# Patient Record
Sex: Male | Born: 1980 | Race: White | Hispanic: No | Marital: Married | Smoking: Current every day smoker
Health system: Southern US, Community
[De-identification: ages and names within clinical notes are randomized; demographics above are authoritative.]

## PROBLEM LIST (undated history)

## (undated) DIAGNOSIS — Z8619 Personal history of other infectious and parasitic diseases: Secondary | ICD-10-CM

## (undated) DIAGNOSIS — I10 Essential (primary) hypertension: Secondary | ICD-10-CM

## (undated) DIAGNOSIS — F419 Anxiety disorder, unspecified: Secondary | ICD-10-CM

## (undated) HISTORY — PX: ORTHOPEDIC SURGERY: SHX850

## (undated) HISTORY — PX: CHOLECYSTECTOMY: SHX55

## (undated) HISTORY — DX: Personal history of other infectious and parasitic diseases: Z86.19

## (undated) HISTORY — DX: Essential (primary) hypertension: I10

## (undated) HISTORY — DX: Anxiety disorder, unspecified: F41.9

---

## 2000-07-04 ENCOUNTER — Emergency Department (HOSPITAL_COMMUNITY): Admission: EM | Admit: 2000-07-04 | Discharge: 2000-07-04 | Payer: Self-pay | Admitting: Emergency Medicine

## 2007-06-21 ENCOUNTER — Emergency Department (HOSPITAL_COMMUNITY): Admission: EM | Admit: 2007-06-21 | Discharge: 2007-06-21 | Payer: Self-pay | Admitting: Emergency Medicine

## 2008-09-10 ENCOUNTER — Emergency Department (HOSPITAL_COMMUNITY): Admission: EM | Admit: 2008-09-10 | Discharge: 2008-09-10 | Payer: Self-pay | Admitting: Emergency Medicine

## 2008-09-11 ENCOUNTER — Emergency Department (HOSPITAL_BASED_OUTPATIENT_CLINIC_OR_DEPARTMENT_OTHER): Admission: EM | Admit: 2008-09-11 | Discharge: 2008-09-11 | Payer: Self-pay | Admitting: Emergency Medicine

## 2008-09-11 ENCOUNTER — Ambulatory Visit: Payer: Self-pay | Admitting: Diagnostic Radiology

## 2008-09-15 ENCOUNTER — Inpatient Hospital Stay (HOSPITAL_COMMUNITY): Admission: EM | Admit: 2008-09-15 | Discharge: 2008-09-20 | Payer: Self-pay | Admitting: Internal Medicine

## 2008-09-15 ENCOUNTER — Emergency Department (HOSPITAL_COMMUNITY): Admission: EM | Admit: 2008-09-15 | Discharge: 2008-09-15 | Payer: Self-pay | Admitting: Emergency Medicine

## 2008-09-15 ENCOUNTER — Other Ambulatory Visit: Payer: Self-pay | Admitting: Emergency Medicine

## 2008-09-15 DIAGNOSIS — R1011 Right upper quadrant pain: Secondary | ICD-10-CM

## 2008-09-19 ENCOUNTER — Encounter (INDEPENDENT_AMBULATORY_CARE_PROVIDER_SITE_OTHER): Payer: Self-pay | Admitting: General Surgery

## 2008-09-22 ENCOUNTER — Ambulatory Visit: Payer: Self-pay | Admitting: Diagnostic Radiology

## 2008-09-22 ENCOUNTER — Ambulatory Visit (HOSPITAL_BASED_OUTPATIENT_CLINIC_OR_DEPARTMENT_OTHER): Admission: RE | Admit: 2008-09-22 | Discharge: 2008-09-22 | Payer: Self-pay | Admitting: Family Medicine

## 2008-09-25 ENCOUNTER — Emergency Department (HOSPITAL_BASED_OUTPATIENT_CLINIC_OR_DEPARTMENT_OTHER): Admission: EM | Admit: 2008-09-25 | Discharge: 2008-09-25 | Payer: Self-pay | Admitting: Emergency Medicine

## 2010-07-25 HISTORY — PX: CHOLECYSTECTOMY: SHX55

## 2010-07-25 HISTORY — PX: ORTHOPEDIC SURGERY: SHX850

## 2010-10-16 IMAGING — CT CT PELVIS W/ CM
2 of 8 series · 13 of 46 positions shown, 19 images · IV contrast (agent unspecified)
Comparison: CT abdomen pelvis of 09/10/2008

CT ABDOMEN

CLINICAL DATA: Abdominal pain, nausea, vomiting, diarrhea,
elevated liver function tests

CT ABDOMEN WITHOUT AND WITH CONTRAST
CT PELVIS WITH CONTRAST
TECHNIQUE: Multidetector CT imaging of the abdomen was performed
initially following the standard protocol before administration of
intravenous contrast.  Multidetector CT imaging of the abdomen and
pelvis was then performed following the standard protocol during
the bolus injection of intravenous contrast.
Contrast: 125 ml Wmnipaque-YLL

[Series 5: pancreas portal venous · axial · portal-venous · 0.78mm/px · z∈[-630,-225]mm · 10 of 99 slices shown, 16 images]
[im 9/99  soft-tissue]
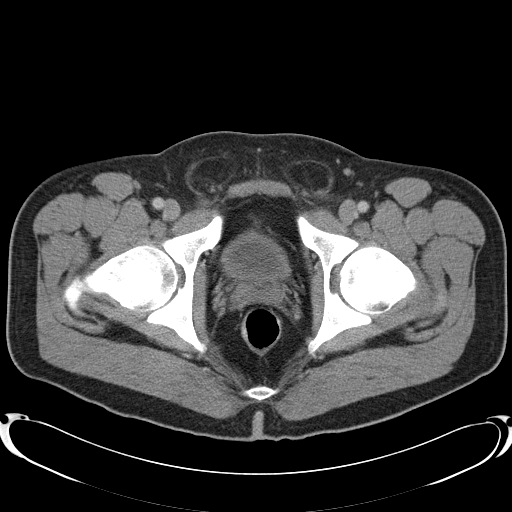
[im 9/99  bone]
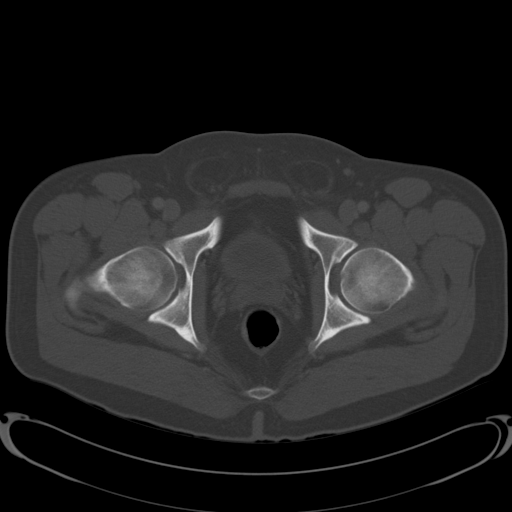
[im 18/99  soft-tissue]
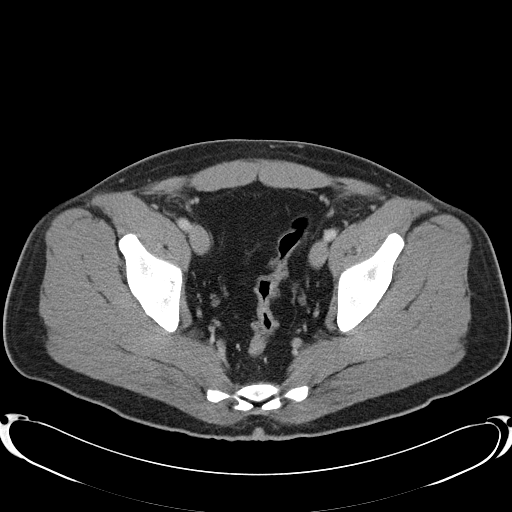
[im 27/99  soft-tissue]
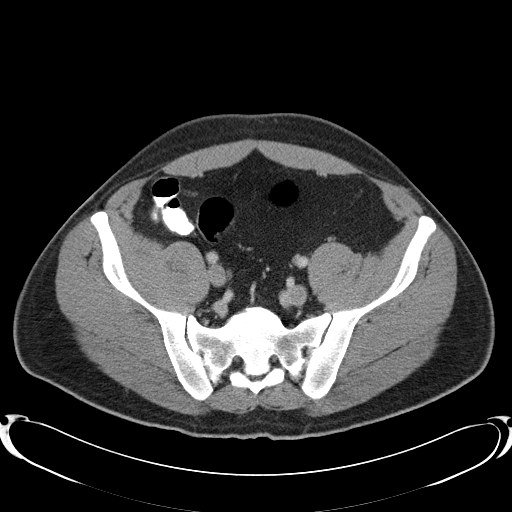
[im 36/99  soft-tissue]
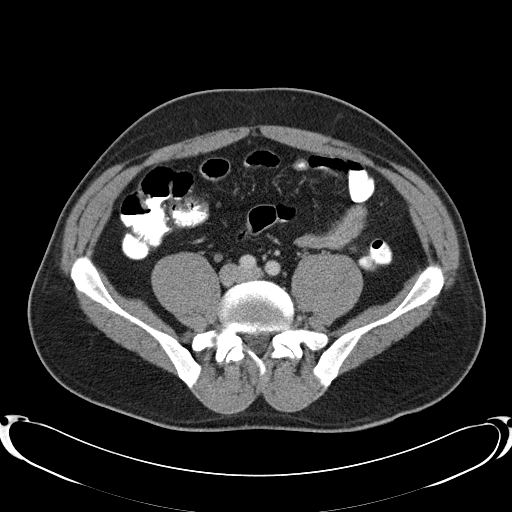
[im 45/99  soft-tissue]
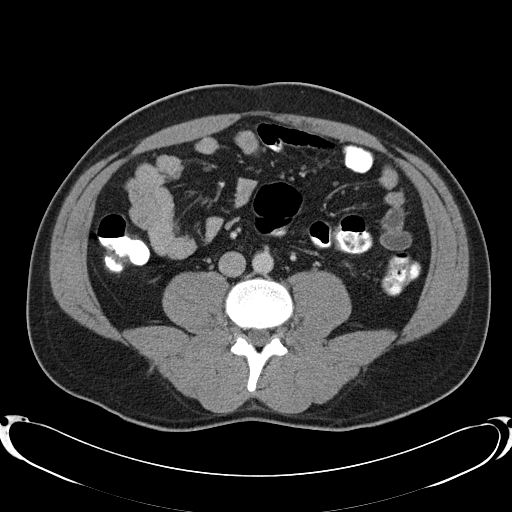
[im 54/99  soft-tissue]
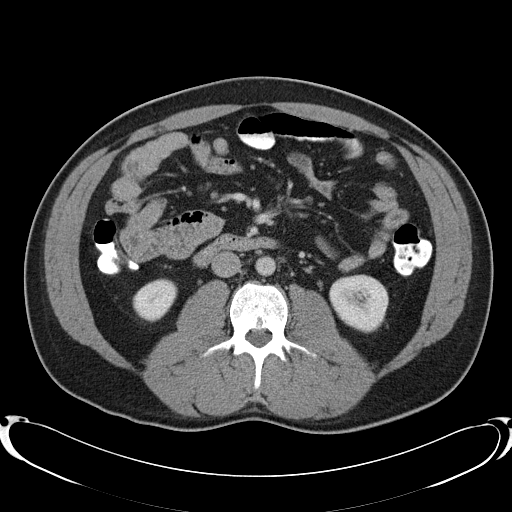
[im 63/99  soft-tissue]
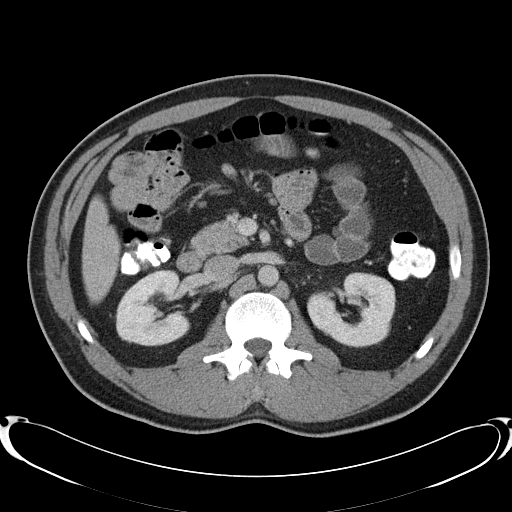
[im 63/99  lung]
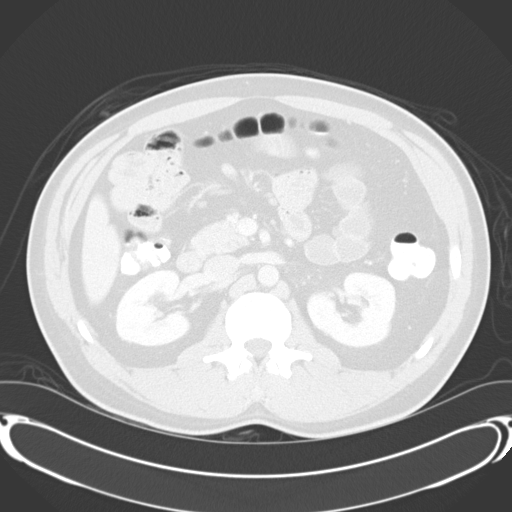
[im 72/99  soft-tissue]
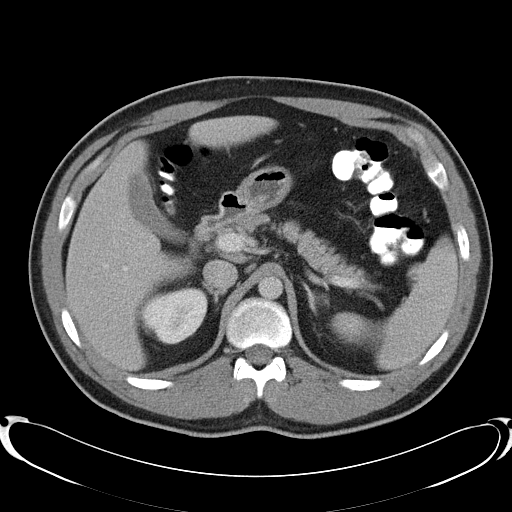
[im 72/99  lung]
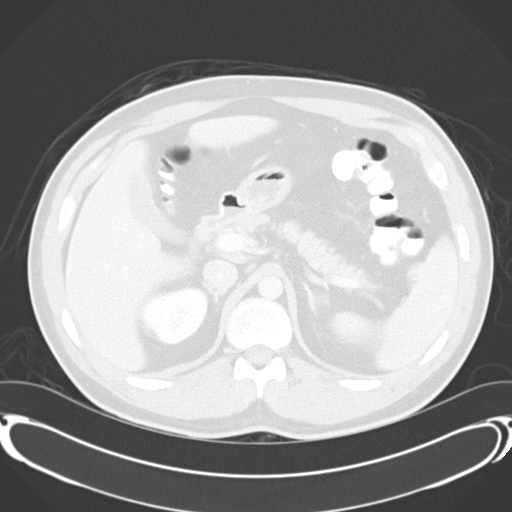
[im 81/99  soft-tissue]
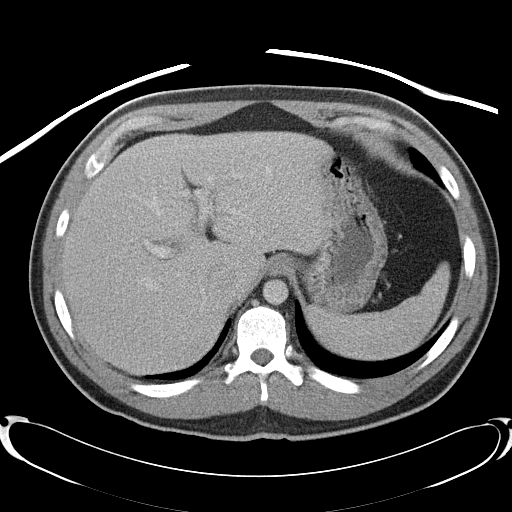
[im 81/99  lung]
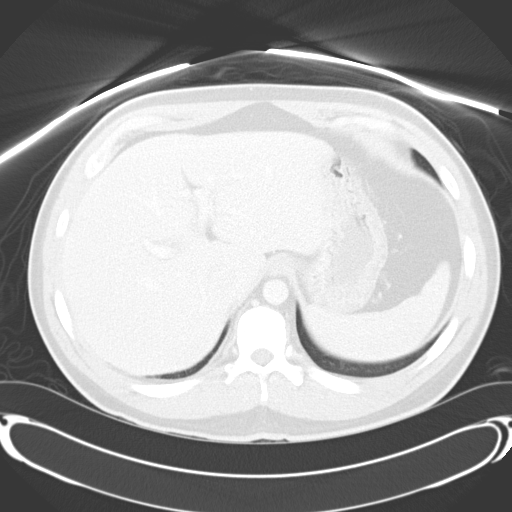
[im 81/99  bone]
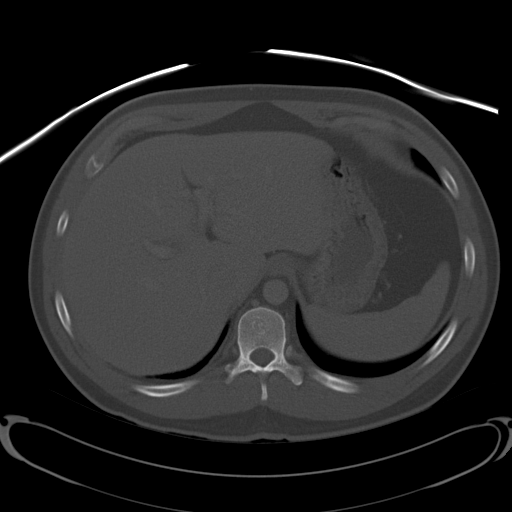
[im 90/99  soft-tissue]
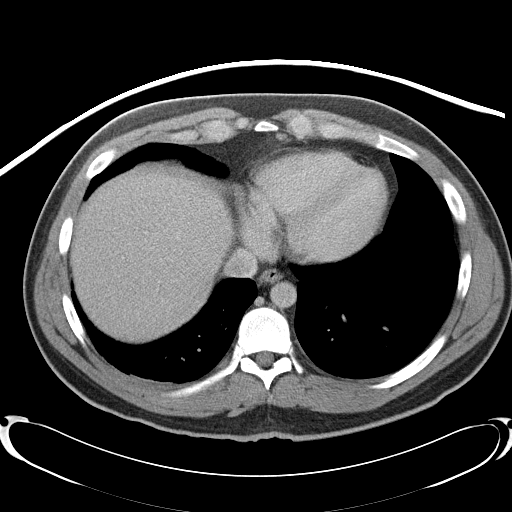
[im 90/99  lung]
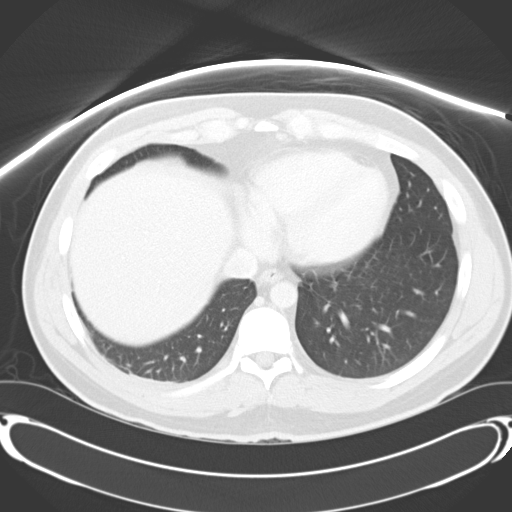

[Series 602: <mpr thick range> · coronal · 0.78mm/px · 3 of 82 slices shown]
[im 21/82  soft-tissue]
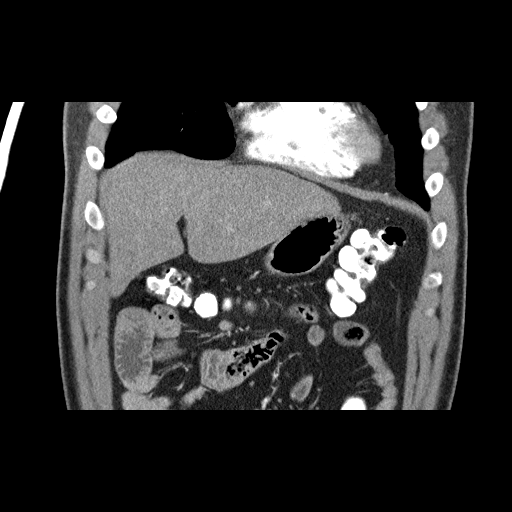
[im 41/82  soft-tissue]
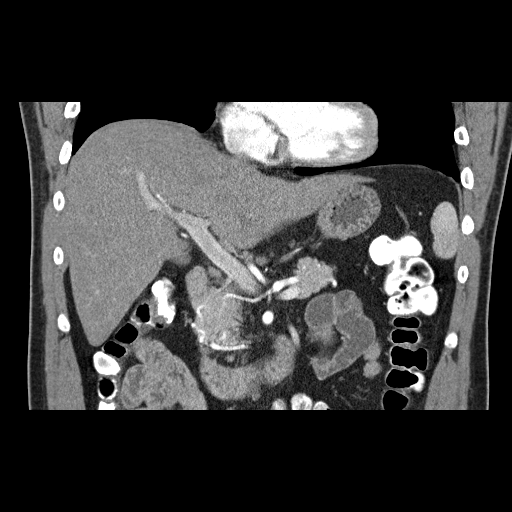
[im 61/82  soft-tissue]
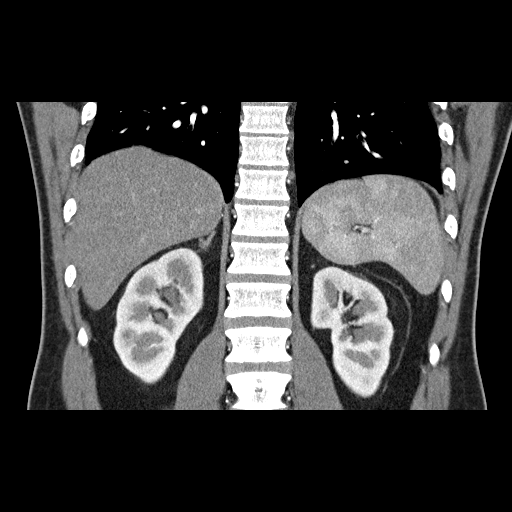

[13 of 46 positions shown; findings below may reference images not displayed]

FINDINGS: The lung bases are clear. On the unenhanced study no
renal calculi are seen.  No calcified gallstones are noted.

 After contrast administration, the liver enhances with no focal
abnormality and no ductal dilatation is seen. The arterial phase
shows the origin of the celiac axis to be narrowed. The origin of
the SMA and  both renal arteries appear normal., No calcified
gallstones are noted.  The pancreas is normal in size and the
pancreatic duct is not dilated. There is no CT evidence of acute
pancreatitis.  The adrenal glands and spleen appear normal. The
kidneys enhance with no mass and no hydronephrosis.  The aorta is
normal in caliber. No adenopathy is seen, although multiple small
mesenteric nodes are present.
IMPRESSION: 1. No abnormality.  No CT evidence of pancreatitis is seen.
 2.  Narrowed origin of the celiac axis.

CT PELVIS
FINDINGS: The urinary bladder is not well distended and is
slightly thick-walled.  The appendix is well seen and appears
normal, as is the terminal ileum.  The prostate is normal in size.
No pelvic mass, fluid, or adenopathy is seen. No bony abnormality
is seen.
IMPRESSION: Negative CT the pelvis.  The appendix and terminal ileum appear
normal.

## 2010-11-04 LAB — COMPREHENSIVE METABOLIC PANEL
ALT: 507 U/L — ABNORMAL HIGH (ref 0–53)
AST: 102 U/L — ABNORMAL HIGH (ref 0–37)
BUN: 9 mg/dL (ref 6–23)
CO2: 26 mEq/L (ref 19–32)
Calcium: 9.3 mg/dL (ref 8.4–10.5)
GFR calc non Af Amer: >60 mL/min (ref >60)
Potassium: 4.6 mEq/L (ref 3.5–5.1)
Sodium: 141 mEq/L (ref 135–145)
Total Bilirubin: 1.4 mg/dL — ABNORMAL HIGH (ref 0.3–1.2)
Total Protein: 7.5 g/dL (ref 6.0–8.3)

## 2010-11-04 LAB — CBC
HCT: 44.8 % (ref 39.0–52.0)
Hemoglobin: 14.8 g/dL (ref 13.0–17.0)
MCHC: 33 g/dL (ref 30.0–36.0)
MCV: 92 fL (ref 78.0–100.0)
RDW: 12.1 % (ref 11.5–15.5)

## 2010-11-04 LAB — DIFFERENTIAL
Basophils Absolute: 0 10*3/uL (ref 0.0–0.1)
Basophils Relative: 0 % (ref 0–1)
Eosinophils Relative: 2 % (ref 0–5)
Lymphs Abs: 2.6 10*3/uL (ref 0.7–4.0)
Monocytes Relative: 10 % (ref 3–12)
Neutrophils Relative %: 66 % (ref 43–77)

## 2010-11-04 LAB — URINALYSIS, ROUTINE W REFLEX MICROSCOPIC
Ketones, ur: NEGATIVE mg/dL
Nitrite: NEGATIVE
Specific Gravity, Urine: 1.021 (ref 1.005–1.030)
pH: 6.5 (ref 5.0–8.0)

## 2010-11-09 LAB — DIFFERENTIAL
Basophils Absolute: 0 10*3/uL (ref 0.0–0.1)
Basophils Relative: 0 % (ref 0–1)
Basophils Relative: 0 % (ref 0–1)
Eosinophils Absolute: 0.8 10*3/uL — ABNORMAL HIGH (ref 0.0–0.7)
Eosinophils Absolute: 0.3 10*3/uL (ref 0.0–0.7)
Eosinophils Relative: 11 % — ABNORMAL HIGH (ref 0–5)
Eosinophils Relative: 2 % (ref 0–5)
Eosinophils Relative: 5 % (ref 0–5)
Lymphocytes Relative: 25 % (ref 12–46)
Lymphocytes Relative: 14 % (ref 12–46)
Lymphs Abs: 1.7 10*3/uL (ref 0.7–4.0)
Lymphs Abs: 1.7 10*3/uL (ref 0.7–4.0)
Monocytes Absolute: 0.8 10*3/uL (ref 0.1–1.0)
Monocytes Absolute: 0.7 10*3/uL (ref 0.1–1.0)
Monocytes Relative: 6 % (ref 3–12)
Monocytes Relative: 7 % (ref 3–12)
Neutro Abs: 3.7 10*3/uL (ref 1.7–7.7)
Neutro Abs: 9.5 10*3/uL — ABNORMAL HIGH (ref 1.7–7.7)
Neutrophils Relative %: 53 % (ref 43–77)
Neutrophils Relative %: 76 % (ref 43–77)

## 2010-11-09 LAB — URINALYSIS, ROUTINE W REFLEX MICROSCOPIC
Bilirubin Urine: NEGATIVE
Glucose, UA: NEGATIVE mg/dL
Hgb urine dipstick: NEGATIVE
Hgb urine dipstick: NEGATIVE
Ketones, ur: NEGATIVE mg/dL
Leukocytes, UA: NEGATIVE
Protein, ur: 30 mg/dL — AB
Specific Gravity, Urine: 1.031 — ABNORMAL HIGH (ref 1.005–1.030)
Specific Gravity, Urine: 1.023 (ref 1.005–1.030)
Urobilinogen, UA: 1 mg/dL (ref 0.0–1.0)
pH: 6.5 (ref 5.0–8.0)
pH: 6 (ref 5.0–8.0)

## 2010-11-09 LAB — COMPREHENSIVE METABOLIC PANEL
ALT: 1441 U/L — ABNORMAL HIGH (ref 0–53)
ALT: 1843 U/L — ABNORMAL HIGH (ref 0–53)
ALT: 706 U/L — ABNORMAL HIGH (ref 0–53)
AST: 649 U/L — ABNORMAL HIGH (ref 0–37)
Albumin: 2.8 g/dL — ABNORMAL LOW (ref 3.5–5.2)
Albumin: 3 g/dL — ABNORMAL LOW (ref 3.5–5.2)
Albumin: 4.2 g/dL (ref 3.5–5.2)
Alkaline Phosphatase: 79 U/L (ref 39–117)
Alkaline Phosphatase: 82 U/L (ref 39–117)
Alkaline Phosphatase: 91 U/L (ref 39–117)
Alkaline Phosphatase: 97 U/L (ref 39–117)
BUN: 3 mg/dL — ABNORMAL LOW (ref 6–23)
BUN: 4 mg/dL — ABNORMAL LOW (ref 6–23)
BUN: 4 mg/dL — ABNORMAL LOW (ref 6–23)
BUN: 6 mg/dL (ref 6–23)
CO2: 25 mEq/L (ref 19–32)
CO2: 29 mEq/L (ref 19–32)
CO2: 28 mEq/L (ref 19–32)
Calcium: 8.4 mg/dL (ref 8.4–10.5)
Calcium: 8.8 mg/dL (ref 8.4–10.5)
Calcium: 9.1 mg/dL (ref 8.4–10.5)
Chloride: 105 mEq/L (ref 96–112)
Chloride: 106 mEq/L (ref 96–112)
Chloride: 105 mEq/L (ref 96–112)
Creatinine, Ser: 1.21 mg/dL (ref 0.4–1.5)
Creatinine, Ser: 1.26 mg/dL (ref 0.4–1.5)
Creatinine, Ser: 1.26 mg/dL (ref 0.4–1.5)
Creatinine, Ser: 1.1 mg/dL (ref 0.4–1.5)
GFR calc Af Amer: >60 mL/min (ref >60)
GFR calc Af Amer: >60 mL/min (ref >60)
GFR calc non Af Amer: >60 mL/min (ref >60)
GFR calc non Af Amer: >60 mL/min (ref >60)
GFR calc non Af Amer: >60 mL/min (ref >60)
Glucose, Bld: 101 mg/dL — ABNORMAL HIGH (ref 70–99)
Glucose, Bld: 106 mg/dL — ABNORMAL HIGH (ref 70–99)
Glucose, Bld: 114 mg/dL — ABNORMAL HIGH (ref 70–99)
Glucose, Bld: 107 mg/dL — ABNORMAL HIGH (ref 70–99)
Potassium: 3.8 mEq/L (ref 3.5–5.1)
Potassium: 4 mEq/L (ref 3.5–5.1)
Potassium: 4.3 mEq/L (ref 3.5–5.1)
Sodium: 136 mEq/L (ref 135–145)
Sodium: 139 mEq/L (ref 135–145)
Sodium: 144 mEq/L (ref 135–145)
Sodium: 138 mEq/L (ref 135–145)
Total Bilirubin: 1.4 mg/dL — ABNORMAL HIGH (ref 0.3–1.2)
Total Bilirubin: 1.8 mg/dL — ABNORMAL HIGH (ref 0.3–1.2)
Total Protein: 5.2 g/dL — ABNORMAL LOW (ref 6.0–8.3)
Total Protein: 5.5 g/dL — ABNORMAL LOW (ref 6.0–8.3)
Total Protein: 5.6 g/dL — ABNORMAL LOW (ref 6.0–8.3)
Total Protein: 7.5 g/dL (ref 6.0–8.3)
Total Protein: 7.9 g/dL (ref 6.0–8.3)

## 2010-11-09 LAB — CBC
HCT: 37 % — ABNORMAL LOW (ref 39.0–52.0)
HCT: 37.7 % — ABNORMAL LOW (ref 39.0–52.0)
HCT: 38.7 % — ABNORMAL LOW (ref 39.0–52.0)
Hemoglobin: 12.5 g/dL — ABNORMAL LOW (ref 13.0–17.0)
Hemoglobin: 16.3 g/dL (ref 13.0–17.0)
MCHC: 33.2 g/dL (ref 30.0–36.0)
MCHC: 33.4 g/dL (ref 30.0–36.0)
MCHC: 33.8 g/dL (ref 30.0–36.0)
MCHC: 33.5 g/dL (ref 30.0–36.0)
MCV: 93.6 fL (ref 78.0–100.0)
MCV: 94.1 fL (ref 78.0–100.0)
MCV: 92.6 fL (ref 78.0–100.0)
Platelets: 230 10*3/uL (ref 150–400)
RBC: 3.98 MIL/uL — ABNORMAL LOW (ref 4.22–5.81)
RBC: 4.14 MIL/uL — ABNORMAL LOW (ref 4.22–5.81)
RBC: 5.21 MIL/uL (ref 4.22–5.81)
RDW: 12.2 % (ref 11.5–15.5)
RDW: 12.4 % (ref 11.5–15.5)
RDW: 11.7 % (ref 11.5–15.5)
WBC: 6.9 10*3/uL (ref 4.0–10.5)
WBC: 7.3 10*3/uL (ref 4.0–10.5)
WBC: 12.6 10*3/uL — ABNORMAL HIGH (ref 4.0–10.5)

## 2010-11-09 LAB — PROTIME-INR: Prothrombin Time: 17.8 seconds — ABNORMAL HIGH (ref 11.6–15.2)

## 2010-11-09 LAB — ACETAMINOPHEN LEVEL: Acetaminophen (Tylenol), Serum: <10 ug/mL — ABNORMAL LOW (ref 10–30)

## 2010-11-09 LAB — URINE CULTURE
Colony Count: NO GROWTH
Culture: NO GROWTH

## 2010-11-09 LAB — BILIRUBIN, DIRECT: Bilirubin, Direct: 0.7 mg/dL — ABNORMAL HIGH (ref 0.0–0.3)

## 2010-11-09 LAB — HEPATITIS PANEL, ACUTE: Hep B C IgM: NEGATIVE

## 2010-12-07 NOTE — H&P (Signed)
NAMEJASUN, Erik Velasquez          ACCOUNT NO.:  [id number]   MEDICAL RECORD NO.:  [id number]          PATIENT TYPE:  INP   LOCATION:  1539                         FACILITY:  [facility]   PHYSICIAN:  Hollice Espy, M.D.DATE OF BIRTH:  [birth year]   DATE OF ADMISSION:  09/15/2008  DATE OF DISCHARGE:                              HISTORY & PHYSICAL   PRIMARY CARE PHYSICIAN:  Joycelyn Rua, M.D.   CHIEF COMPLAINT:  Abdominal pain.   HISTORY OF PRESENT ILLNESS:  The patient is a 30 year old white male  with no past medical history who a little over the past 10 days has had  problems with nausea, vomiting and abdominal pain.  His abdominal pain  is located between the midepigastric and right upper quadrant.  He has  had such severe nausea, vomiting and pain that he has been to the  emergency room several times.  When he first presented on September 10, 2008, he had a CT of the abdomen and pelvis checked.  While the  impression said no acute abdominal or pelvic abnormalities, the only  comment was unenhanced CT was done, lack of IV contrast limited  sensitivity and __________ and the pelvis mildly distended.  Loops of  small bowel were present in the right lower quadrant.  The patient was  sent home on nausea medications, and again symptoms have persisted.  He  had limited himself to just some toast and juice, and his symptoms had  started to come down and improve while taking nausea medicine and  occasional Motrin, but then when he tried he eat some solid steak, he  started having severe again belly pain and nausea and vomiting.  He,  prior to these symptoms early on, had been taking much Tylenol, but  otherwise there were no other medications.  When he first presented, he  had lab work done which was essentially unremarkable, noting a white  count of 11.7 but with no shift, and his liver function tests noted an  ALT of 706 with an AST of 384, normal bilirubin, normal alkaline  phosphatase.  Lipase level was also normal at that time.  When he  presented back again to the emergency room today, his white count was up  to 12.6, but again no shift.  The lipase level was still normal.  However, now his transaminases noted a total bilirubin of 1.5 and  alkaline phosphatase of 145, AST is up to 649 and ALT was up to 1738.  It was felt best that he come in for further evaluation.  He was  transferred from [facility] ER over to here.   He received medication for pain and nausea, but when I saw him, he was  still complaining of some right upper quadrant pain and feeling  nauseated.  He denied any headaches, vision changes or dysphagia.  No  chest pain, palpitations.  No shortness of breath, wheeze, cough.  No  hematuria, dysuria.  Bowels are moving regularly.  No focal extremity  numbness, weakness or pain other than this abdominal pain.  He has also  has had a couple of abdominal pain  episodes that have radiated to his  back, but it was more to his lower back.   REVIEW OF SYSTEMS:  Otherwise negative.   PAST MEDICAL HISTORY:  None.   MEDICATIONS:  None other than nausea medications he received from the  ER.   ALLERGIES:  None.   SOCIAL HISTORY:  He does smoke tobacco, less than a pack a day.  He used  to occasionally drink somewhat heavily on the weekends when he was  younger, but he says he quit that now that he has a kid and rarely  drinks.  Denies any drug use.   FAMILY HISTORY:  Notable for gallbladder disease.   PHYSICAL EXAMINATION:  VITALS ON ADMISSION:  Temperature 98.2, heart  rate 86, blood pressure 129/81, respirations 20, O2 saturation 94% on  room air.  GENERAL:  He is alert and oriented x3, in no apparent distress.  HEENT:  Normocephalic and atraumatic.  His mucous membranes are slightly  dry.  He has no carotid bruits.  HEART:  Regular rate and rhythm.  S1-S2.  LUNGS:  Clear to auscultation bilaterally.  ABDOMEN:  Soft.  He has  tenderness halfway between the midepigastrium  and right upper quadrant.  No radiation to the back at this time.  No  flank tenderness.  Nondistended.  Hypoactive bowel sounds.  EXTREMITIES:  No clubbing, cyanosis or edema.   LABORATORY DATA:  Labs today note sodium of 144, potassium 4, chloride  105, bicarbonate 26, BUN 13, creatinine 1.1, glucose 90.  LFTs are noted  for an albumin of 4.2, ALT 1738 which is up, AST 649 which is up,  alkaline phosphatase 145 which is up, total bilirubin 1.5 which is up.  White count 12.6, hemoglobin and hematocrit of 15.5 and 46, MCV of 93,  platelet count 230, 75% neutrophils.   ASSESSMENT AND PLAN:  1. Nausea and vomiting.  2. Right upper quadrant and mid epigastric pain.  3. Transaminitis.  4. Tobacco abuse.  5. Negative lab work except for liver function tests, questionable      liver versus gallbladder versus pancreas.  Recheck abdominal CT,      recheck hepatitis panel and check a Tylenol level.  If these      findings are inconclusive, would then possibly consider a HIDA scan      versus an ultrasound.   I have discussed with the patient, and he denies any illicit drug use.  He has a tattoo on his right arm which he says he actually had gotten  now almost 9 years ago.      Hollice Espy, M.D.  Electronically Signed     SKK/MEDQ  D:  09/16/2008  T:  09/16/2008  Job:  [id number]   cc:   Joycelyn Rua, M.D.  Fax: [phone]

## 2010-12-07 NOTE — Op Note (Signed)
[name], [name]          ACCOUNT NO.:  [id number]   MEDICAL RECORD NO.:  [id number]          PATIENT TYPE:  INP   LOCATION:  1539                         [facility]ACILITY:  [facility]   PHYSICIAN:  Almond Lint, MD       DATE OF BIRTH:  1981-03-29   DATE OF PROCEDURE:  09/19/2008  DATE OF DISCHARGE:                               OPERATIVE REPORT   PREOPERATIVE DIAGNOSIS:  Cholecystitis.   POSTOPERATIVE DIAGNOSIS:  Cholecystitis.   PROCEDURE PERFORMED:  Laparoscopic cholecystectomy and intraoperative  cholangiogram.   SURGEON:  Byerly.   ASSISTANT:  Gross.   ANESTHESIA:  General and local.   FINDINGS:  Mildly inflamed gallbladder wall and normal cholangiogram  with very small ducts throughout but no evidence of fibrosis.   SPECIMEN:  Gallbladder to pathology.   EBL:  Minimal.   COMPLICATIONS:  None known.   PROCEDURE:  Erik Velasquez was identified in the holding area and taken to  the operating room where he was placed supine on the operating room  table.  General anesthesia was induced.  The abdomen was prepped and  draped in a sterile fashion.  Time-out was performed according to the  surgical safety checklist.  When all was correct, we continued.  The  infraumbilical skin was anesthetized with local anesthesia.  A  curvilinear transverse incision was made just below the umbilicus with a  #11-blade.  The subcutaneous tissues were spread with the [facility] and the  umbilical stalk elevated with a Kocher clamp.  A #1-blade was used to  incise the fascia in the midline and a hemostat used to dilate the  fascia.  The purse-string suture was placed with a 0 Vicryl on a UR6.  The Hasson trocar was introduced into the abdomen and secured to the  abdominal wall with sutures.  Pneumoperitoneum was achieved to a  pressure of 15 mmHg.  Patient was placed into reverse Trendelenburg  position and rotated to the left.  The remaining 3 ports were placed  under direct visualization with the  11-mm port in the epigastrium and  the two 5's in the right upper quadrant.  The gallbladder was elevated  toward the head and the infundibulum retracted laterally.  The  peritoneum was stripped off of the gallbladder and the cystic duct  skeletonized with the [place].  The cystic artery was also skeletonized  with [place].  The Endoshears were used to make a ductotomy on the  cystic duct and a clip was placed on the gallbladder side of the duct.  The cholangiogram catheter was introduced through the abdominal wall.  The ductotomy had to be enlarged with the hook cautery and then a new  ductotomy had to be made lower because of what appeared to be valve.  The catheter was then advanced into the cystic duct and a cholangiogram  was shot demonstrating normal anatomy.  There was good filling into the  duodenum.  There was no evidence of filling defects.  The patient was  placed back into reverse Trendelenburg and the cholangiogram catheter  was removed.  Three clips were placed on the cystic duct on the  patient's side and the artery was clipped and transected.  The  gallbladder was then taken off the gallbladder fossa with the hook  electrocautery.  The gallbladder was placed into a bag and removed  through the umbilical incision.  The liver bed was transected and there  was no bleeding or bile leaking.  The gallbladder fossa was irrigated,  as well as over the liver.  The epigastric and right upper quadrant  ports were removed under direct visualization with no bleeding seen.  The Hasson was then removed and the purse-string suture secured.  There  was no fascial defect.  The skin was then closed using subcuticular 4-0  Monocryl.  The wounds were cleaned and dried and dressed with Dermabond.  The patient was awakened from anesthesia and taken to the PACU in stable  condition.      Almond Lint, MD  Electronically Signed     FB/MEDQ  D:  09/19/2008  T:  09/19/2008  Job:  [id number]

## 2010-12-07 NOTE — Consult Note (Signed)
NAMEJENTZEN, MINASYAN          ACCOUNT NO.:  [id number]   MEDICAL RECORD NO.:  [id number]          PATIENT TYPE:  INP   LOCATION:  1539                         FACILITY:  [facility]   PHYSICIAN:  Graylin Shiver, M.D.   DATE OF BIRTH:  07/21/1981   DATE OF CONSULTATION:  09/16/2008  DATE OF DISCHARGE:                                 CONSULTATION   We were asked to see Mr. Shively today in consultation for right quadrant  abdominal pain and elevated LFTs by Dr. Ramiro Harvest of the [facility].   HISTORY OF PRESENT ILLNESS:  This is a 30 year old male with no past  medical history who started having symptoms of body aches and fever  approximately 10 days ago.  Since then he has had 3 episodes of severe  pain.  The first one was in his right lower back, which took him to the  emergency room.  The two episodes following that were both in his right  upper quadrant of his abdomen.  These were all precipitated by eating.  The patient endorses vomiting, endorses fever.  He also had 1 bout of  diarrhea yesterday.  The patient reports feeling much better between  episodes of severe pain.  His pain was not relieved until he came to the  emergency room and received pain medication.  He denies any recent  travel or any new prescriptions or any recent drug use.   PAST MEDICAL HISTORY:  None.   CURRENT MEDICATIONS:  None.   ALLERGIES:  He has no known drug allergies.   REVIEW OF SYSTEMS:  As per HPI.   SOCIAL HISTORY:  Positive for alcohol use only on the weekends.  Negative for tobacco.   FAMILY HISTORY:  Strong with gallbladder disease.   PHYSICAL EXAM:  CONSTITUTIONAL:  He is alert and oriented in no apparent  distress.  HEENT:  He has no icterus.  No jaundice.  VITAL SIGNS:  Temperature is 99.2, pulse 61, respirations 16, blood  pressure is 139/87.  HEART:  Has a regular rate and rhythm.  LUNGS:  Clear.  ABDOMEN:  Nontender.  Nondistended with good bowel sounds.   LABS:  Today show an AST 622, ALT 1346, alk phos of 91, total bilirubin  1.8.  BMET is within normal limits.  CBC shows a hemoglobin of 12.6,  hematocrit 37.9, white count 9.7, platelets 192,000.  LFTs seem to have  peaked on February 22 when he had an AST of 649, ALT of 1738, alk phos  145, total bilirubin 1.8.  He has an acute hepatitis panel pending as  well as another lipase pending.  Previous lipase normal.  Radiological  exams include a CT scan that was done September 10, 2008.  It was  negative.  He has another CT scan of his abdomen and pelvis that was  done today, results are pending.   ASSESSMENT:  Dr. Herbert Moors has seen and examined the patient,  collected a history and reviewed his chart.  His impression is this is a  30 year old male with episodic abdominal pain in his right upper  quadrant precipitated by  food.  He also has elevated liver enzymes.  We will rule out gallbladder  disease with an ultrasound of his abdomen as well as HIDA scan if the  ultrasound is negative.  Will rule out hepatitis and await the hepatitis  panel.  Thanks very much for this consultation.      Stephani Police, PA    ______________________________  Graylin Shiver, M.D.    MLY/MEDQ  D:  09/16/2008  T:  09/16/2008  Job:  [id number]   cc:   Graylin Shiver, M.D.  Fax: [phone]

## 2010-12-07 NOTE — Discharge Summary (Signed)
NAMEGRAVES, NIPP          ACCOUNT NO.:  [id number]   MEDICAL RECORD NO.:  [id number]          PATIENT TYPE:  INP   LOCATION:  1539                         [facility]   PHYSICIAN:  Kela Millin, M.D.DATE OF BIRTH:  03/26/1981   DATE OF ADMISSION:  09/15/2008  DATE OF DISCHARGE:  09/20/2008                               DISCHARGE SUMMARY   DISCHARGE DIAGNOSES:  1. Cholecystitis - status post cholecystectomy.  2. Elevated liver function tests - improving.  3. Tobacco abuse.   CONSULTATIONS:  1. Gastroenterology - Dr. Matthias Hughs.  2. Surgery, Dr. Donell Beers.   PROCEDURES/STUDIES:  1. CT scan of abdomen and pelvis - no abnormality, no CT evidence of      pancreatitis seen.  Narrowed origin of the celiac axis.  2. CT scan of pelvis - appendix and terminal ileum appear normal.  3. Abdominal ultrasound - negative, normal gallbladder.  4. HIDA scan - nonvisualization of the gallbladder, consistent with      cystic duct obstruction.  5. Laparoscopic cholecystectomy with intraoperative cholangiogram -      per Dr. Donell Beers, mildly inflamed gallbladder wall with normal      cholangiogram with many small ducts throughout, but no evidence of      fibrosis.   BRIEF HISTORY:  The patient is a 30 year old white male who presented  with complaints of abdominal pain - midepigastric and right upper  quadrant in location.  He also had associated nausea, vomiting and had  been to the emergency room several times with the symptoms.  In the ER,  he had a lipase done which was within normal limits.  His transaminases  were noted to be elevated with AST of 649, ALT of 1738, alkaline  phosphatase 145, total bilirubin of 1.5.  He was admitted for further  evaluation and management.   Please see the full admission history and physical dictated on September 15, 2008 by Dr. Rito Ehrlich for the details of the admission physical exam  as well as the laboratory data.   HOSPITAL COURSE:  1.  Cholecystitis - upon admission, the patient had a CT scan of his      abdomen and the results as stated above.  A follow-up ultrasound      was done which was within normal limits.  Following admission,      Gastroenterology was consulted and Dr. Evette Cristal saw the patient      initially and agreed with the plan to do a HIDA scan, since the      ultrasound was negative.  The HIDA scan was done and it revealed  nonvisualization of the gallbladder consistent with cystic duct  obstruction.  Following this, Surgery was consulted and saw the patient  and were concerned about hepatocellular disease given the elevated LFTs.  A hepatitis panel was done and this was negative.  GI/Dr. Buccini  followed up with the patient and stated that he favored empiric  laparoscopic cholecystectomy and that intraoperative liver biopsy should  be considered if the gallbladder/biliary tree was normal.  Surgery  followed up and scheduled the patient for laparoscopic cholecystectomy  which was done  on September 19, 2008.  The findings are as above with  mildly inflamed gallbladder wall and no evidence of fibrosis.  The  gallbladder specimen was sent to Pathology and the results of that are  pending at the time of this dictation.  Dr. Donell Beers rounded on the  patient this morning and he is tolerating p.o. well.  No nausea,  vomiting, much decreased abdominal pain.  She has recommended that he be  discharged home and I agree.  The patient is to follow up as an  outpatient with Surgery for the pathology results.  He remained afebrile  during this hospital stay with no leukocytosis - his last white cell  count prior to discharge was 7.3, and he has remained hemodynamically  stable.  He is to follow up with Dr. Donell Beers in 2 weeks as directed.  1. Elevated liver function tests - following admission, his LFTs were      monitored and they continued to increase reaching a peak on      September 17, 2008 with a total bilirubin of 2.2,  SGOT of 868, ALT      of 1843.  GI was consulted as above and followed the patient.      Abdominal ultrasound was done and the results are stated above,      HIDA scan was also done showing the nonvisualization of the      gallbladder.  A hepatitis panel was done and this was negative.      The LFTs began to trend down and have continued to do so even      following his surgery - his LFTs today, September 20, 2008 - AST of      325,  ALT 1159 and his alkaline phosphatase 94, total bilirubin of      1.4.  The patient is to follow up with his primary care Iceis Knab in      a week to have the LFTs rechecked.  Also to follow up with Dr.      Donell Beers and his primary care physician for results of the      gallbladder pathology.  2. Tobacco abuse - he was counseled to quit tobacco.   DISCHARGE MEDICATIONS:  1. Percocet 5/325 mg one p.o. q.6 h p.r.n.  2. Advil 400 mg p.o. t.i.d. p.r.n.  3. Colace 100 mg p.o. b.i.d. p.r.n. constipation.   FOLLOW-UP CARE:  1. Primary care Jacqualine Weichel/Christina Lemene in 1 week and he is to have      liver function tests done at the time of this follow-up.  2. Dr. Donell Beers in 2 weeks, call at [phone] for an appointment.   DISCHARGE CONDITION:  Improved/Stable.      Kela Millin, M.D.  Electronically Signed     ACV/MEDQ  D:  09/20/2008  T:  09/20/2008  Job:  [id number]   cc:   Almond Lint, MD  [address]  [place] [address]   [facility]

## 2016-11-15 ENCOUNTER — Encounter: Payer: Self-pay | Admitting: Physician Assistant

## 2016-11-15 ENCOUNTER — Ambulatory Visit (INDEPENDENT_AMBULATORY_CARE_PROVIDER_SITE_OTHER): Payer: BLUE CROSS/BLUE SHIELD | Admitting: Physician Assistant

## 2016-11-15 ENCOUNTER — Other Ambulatory Visit: Payer: Self-pay | Admitting: Physician Assistant

## 2016-11-15 VITALS — BP 138/92 | HR 88 | Temp 98.6°F | Resp 14 | Ht 71.0 in | Wt 269.0 lb

## 2016-11-15 DIAGNOSIS — Z9049 Acquired absence of other specified parts of digestive tract: Secondary | ICD-10-CM | POA: Diagnosis not present

## 2016-11-15 DIAGNOSIS — I10 Essential (primary) hypertension: Secondary | ICD-10-CM | POA: Diagnosis not present

## 2016-11-15 MED ORDER — LISINOPRIL 10 MG PO TABS: 10.0000 mg | ORAL_TABLET | Freq: Every day | ORAL | 5 refills | Status: DC

## 2016-11-15 NOTE — Patient Instructions (Addendum)
Please restart the lisinopril. I have sent in a prescription. Follow the diet below to help with blood pressure. Continue exercise regimen -- Goal is for at least 150 minutes of exercise per week.  Follow-up in 2 weeks for reassessment of your blood pressure and for a complete physical. Come fasting to that appointment.    DASH Eating Plan DASH stands for "Dietary Approaches to Stop Hypertension." The DASH eating plan is a healthy eating plan that has been shown to reduce high blood pressure (hypertension). It may also reduce your risk for type 2 diabetes, heart disease, and stroke. The DASH eating plan may also help with weight loss. What are tips for following this plan? General guidelines   Avoid eating more than 2,300 mg (milligrams) of salt (sodium) a day. If you have hypertension, you may need to reduce your sodium intake to 1,500 mg a day.  Limit alcohol intake to no more than 1 drink a day for nonpregnant women and 2 drinks a day for men. One drink equals 12 oz of beer, 5 oz of wine, or 1 oz of hard liquor.  Work with your health care provider to maintain a healthy body weight or to lose weight. Ask what an ideal weight is for you.  Get at least 30 minutes of exercise that causes your heart to beat faster (aerobic exercise) most days of the week. Activities may include walking, swimming, or biking.  Work with your health care provider or diet and nutrition specialist (dietitian) to adjust your eating plan to your individual calorie needs. Reading food labels   Check food labels for the amount of sodium per serving. Choose foods with less than 5 percent of the Daily Value of sodium. Generally, foods with less than 300 mg of sodium per serving fit into this eating plan.  To find whole grains, look for the word "whole" as the first word in the ingredient list. Shopping   Buy products labeled as "low-sodium" or "no salt added."  Buy fresh foods. Avoid canned foods and premade or  frozen meals. Cooking   Avoid adding salt when cooking. Use salt-free seasonings or herbs instead of table salt or sea salt. Check with your health care provider or pharmacist before using salt substitutes.  Do not fry foods. Cook foods using healthy methods such as baking, boiling, grilling, and broiling instead.  Cook with heart-healthy oils, such as olive, canola, soybean, or sunflower oil. Meal planning    Eat a balanced diet that includes:  5 or more servings of fruits and vegetables each day. At each meal, try to fill half of your plate with fruits and vegetables.  Up to 6-8 servings of whole grains each day.  Less than 6 oz of lean meat, poultry, or fish each day. A 3-oz serving of meat is about the same size as a deck of cards. One egg equals 1 oz.  2 servings of low-fat dairy each day.  A serving of nuts, seeds, or beans 5 times each week.  Heart-healthy fats. Healthy fats called Omega-3 fatty acids are found in foods such as flaxseeds and coldwater fish, like sardines, salmon, and mackerel.  Limit how much you eat of the following:  Canned or prepackaged foods.  Food that is high in trans fat, such as fried foods.  Food that is high in saturated fat, such as fatty meat.  Sweets, desserts, sugary drinks, and other foods with added sugar.  Full-fat dairy products.  Do not salt foods before  eating.  Try to eat at least 2 vegetarian meals each week.  Eat more home-cooked food and less restaurant, buffet, and fast food.  When eating at a restaurant, ask that your food be prepared with less salt or no salt, if possible. What foods are recommended? The items listed may not be a complete list. Talk with your dietitian about what dietary choices are best for you. Grains  Whole-grain or whole-wheat bread. Whole-grain or whole-wheat pasta. Brown rice. Orpah Cobb. Bulgur. Whole-grain and low-sodium cereals. Pita bread. Low-fat, low-sodium crackers. Whole-wheat flour  tortillas. Vegetables  Fresh or frozen vegetables (raw, steamed, roasted, or grilled). Low-sodium or reduced-sodium tomato and vegetable juice. Low-sodium or reduced-sodium tomato sauce and tomato paste. Low-sodium or reduced-sodium canned vegetables. Fruits  All fresh, dried, or frozen fruit. Canned fruit in natural juice (without added sugar). Meat and other protein foods  Skinless chicken or [place]. Ground chicken or [place]. Pork with fat trimmed off. Fish and seafood. Egg whites. Dried beans, peas, or lentils. Unsalted nuts, nut butters, and seeds. Unsalted canned beans. Lean cuts of beef with fat trimmed off. Low-sodium, lean deli meat. Dairy  Low-fat (1%) or fat-free (skim) milk. Fat-free, low-fat, or reduced-fat cheeses. Nonfat, low-sodium ricotta or cottage cheese. Low-fat or nonfat yogurt. Low-fat, low-sodium cheese. Fats and oils  Soft margarine without trans fats. Vegetable oil. Low-fat, reduced-fat, or light mayonnaise and salad dressings (reduced-sodium). Canola, safflower, olive, soybean, and sunflower oils. Avocado. Seasoning and other foods  Herbs. Spices. Seasoning mixes without salt. Unsalted popcorn and pretzels. Fat-free sweets. What foods are not recommended? The items listed may not be a complete list. Talk with your dietitian about what dietary choices are best for you. Grains  Baked goods made with fat, such as croissants, muffins, or some breads. Dry pasta or rice meal packs. Vegetables  Creamed or fried vegetables. Vegetables in a cheese sauce. Regular canned vegetables (not low-sodium or reduced-sodium). Regular canned tomato sauce and paste (not low-sodium or reduced-sodium). Regular tomato and vegetable juice (not low-sodium or reduced-sodium). Rosita Fire. Olives. Fruits  Canned fruit in a light or heavy syrup. Fried fruit. Fruit in cream or butter sauce. Meat and other protein foods  Fatty cuts of meat. Ribs. Fried meat. Tomasa Blase. Sausage. Bologna and other processed  lunch meats. Salami. Fatback. Hotdogs. Bratwurst. Salted nuts and seeds. Canned beans with added salt. Canned or smoked fish. Whole eggs or egg yolks. Chicken or [place] with skin. Dairy  Whole or 2% milk, cream, and half-and-half. Whole or full-fat cream cheese. Whole-fat or sweetened yogurt. Full-fat cheese. Nondairy creamers. Whipped toppings. Processed cheese and cheese spreads. Fats and oils  Butter. Stick margarine. Lard. Shortening. Ghee. Bacon fat. Tropical oils, such as coconut, palm kernel, or palm oil. Seasoning and other foods  Salted popcorn and pretzels. Onion salt, garlic salt, seasoned salt, table salt, and sea salt. Worcestershire sauce. Tartar sauce. Barbecue sauce. Teriyaki sauce. Soy sauce, including reduced-sodium. Steak sauce. Canned and packaged gravies. Fish sauce. Oyster sauce. Cocktail sauce. Horseradish that you find on the shelf. Ketchup. Mustard. Meat flavorings and tenderizers. Bouillon cubes. Hot sauce and Tabasco sauce. Premade or packaged marinades. Premade or packaged taco seasonings. Relishes. Regular salad dressings. Where to find more information:  National Heart, Lung, and Blood Institute: PopSteam.is  American Heart Association: www.heart.org Summary  The DASH eating plan is a healthy eating plan that has been shown to reduce high blood pressure (hypertension). It may also reduce your risk for type 2 diabetes, heart disease, and stroke.  With  the DASH eating plan, you should limit salt (sodium) intake to 2,300 mg a day. If you have hypertension, you may need to reduce your sodium intake to 1,500 mg a day.  When on the DASH eating plan, aim to eat more fresh fruits and vegetables, whole grains, lean proteins, low-fat dairy, and heart-healthy fats.  Work with your health care provider or diet and nutrition specialist (dietitian) to adjust your eating plan to your individual calorie needs. This information is not intended to replace advice given to you by  your health care provider. Make sure you discuss any questions you have with your health care provider. Document Released: 06/30/2011 Document Revised: 07/04/2016 Document Reviewed: 07/04/2016 Elsevier Interactive Patient Education  2017 [employer].

## 2016-11-15 NOTE — Progress Notes (Signed)
Pre visit review using our clinic review tool, if applicable. No additional management support is needed unless otherwise documented below in the visit note. 

## 2016-11-15 NOTE — Assessment & Plan Note (Addendum)
Patient out of medication for > 2 weeks. Asymptomatic. Repeat BP still elevated. Medications restarted. DASH diet reviewed. Follow-up in 2 weeks for CPE. Will repeat BP at that time.

## 2016-11-15 NOTE — Progress Notes (Signed)
Patient presents to clinic today to establish care.  Chronic Issues: Hypertension -- Patient is currently on a regimen of lisinopril 10 mg daily. Endorses doing well on this regimen previously. Body mass index is 37.52 kg/m.Erik Velasquez Has been out of medication for 2 weeks. Patient denies chest pain, palpitations, lightheadedness, dizziness, vision changes or frequent headaches. Currently for exercise -- walking 2 miles 4 times per week. Patient denies history of hyperlipidemia.   Health Maintenance: Immunizations -- Endorses Tetanus a couple of years prior.   Past Medical History:  Diagnosis Date  . History of chickenpox   . Hypertension     Past Surgical History:  Procedure Laterality Date  . CHOLECYSTECTOMY    . ORTHOPEDIC SURGERY      No current outpatient prescriptions on file prior to visit.   No current facility-administered medications on file prior to visit.     No Known Allergies  Family History  Problem Relation Age of Onset  . Healthy Mother   . Healthy Father   . Healthy Sister   . Healthy Maternal Grandmother   . Alzheimer's disease Maternal Grandfather   . Healthy Son     Social History   Social History  . Marital status: Married    Spouse name: N/A  . Number of children: 1  . Years of education: N/A   Occupational History  . Not on file.   Social History Main Topics  . Smoking status: Former Smoker    Years: 10.00    Types: Cigarettes  . Smokeless tobacco: Never Used  . Alcohol use Yes     Comment: 2 drinks (beer) per week  . Drug use: No  . Sexual activity: Yes   Other Topics Concern  . Not on file   Social History Narrative  . No narrative on file   Review of Systems  Constitutional: Negative for fever and malaise/fatigue.  Eyes: Negative for blurred vision and double vision.  Respiratory: Negative for cough and shortness of breath.   Cardiovascular: Negative for chest pain and palpitations.  Gastrointestinal: Negative for abdominal  pain, blood in stool, constipation, diarrhea, heartburn, melena, nausea and vomiting.  Genitourinary: Negative for dysuria, flank pain, frequency, hematuria and urgency.  Neurological: Negative for dizziness and loss of consciousness.  Endo/Heme/Allergies: Negative for environmental allergies.   BP (!) 138/92 (BP Location: Right Arm)   Pulse 88   Temp 98.6 F (37 C) (Oral)   Resp 14   Ht  (1.803 m)   Wt 269 lb (122 kg)   SpO2 98%   BMI 37.52 kg/m   Physical Exam  Constitutional: He is oriented to person, place, and time and well-developed, well-nourished, and in no distress.  HENT:  Head: Normocephalic and atraumatic.  Eyes: Conjunctivae are normal. Pupils are equal, round, and reactive to light.  Neck: Neck supple. No thyromegaly present.  Cardiovascular: Normal rate, regular rhythm, normal heart sounds and intact distal pulses.   Pulmonary/Chest: Effort normal and breath sounds normal. No respiratory distress. He has no wheezes. He has no rales. He exhibits no tenderness.  Lymphadenopathy:    He has no cervical adenopathy.  Neurological: He is alert and oriented to person, place, and time.  Skin: Skin is warm and dry. No rash noted.  Psychiatric: Affect normal.  Vitals reviewed.  Assessment/Plan: Hypertension Patient out of medication for > 2 weeks. Asymptomatic. Repeat BP still elevated. Medications restarted. DASH diet reviewed. Follow-up in 2 weeks for CPE. Will repeat BP at that time.  Leeanne Rio, PA-C

## 2016-11-29 ENCOUNTER — Ambulatory Visit: Payer: Self-pay | Admitting: Physician Assistant

## 2017-08-21 ENCOUNTER — Telehealth: Payer: Self-pay | Admitting: Physician Assistant

## 2017-08-21 ENCOUNTER — Other Ambulatory Visit: Payer: Self-pay

## 2017-08-21 MED ORDER — LISINOPRIL 10 MG PO TABS: 10.0000 mg | ORAL_TABLET | Freq: Every day | ORAL | 0 refills | Status: DC

## 2017-08-21 NOTE — Telephone Encounter (Signed)
Copied from CRM #[id number]. Topic: Quick Communication - See Telephone Encounter >> Aug 21, 2017 11:48 AM Diana EvesHoyt, Maryann B wrote: CRM for notification. See Telephone encounter for:  Pt needing refill on lisinopril. Pt has been out for 2 days   08/21/17.

## 2017-08-22 ENCOUNTER — Telehealth: Payer: Self-pay | Admitting: Emergency Medicine

## 2017-08-22 ENCOUNTER — Other Ambulatory Visit: Payer: Self-pay | Admitting: Physician Assistant

## 2017-08-22 NOTE — Telephone Encounter (Signed)
Advised patient rx for the Lisinopril was sent to the pharmacy but patient is due for a CPE. He states he will call back to schedule an appointment.

## 2017-10-23 ENCOUNTER — Other Ambulatory Visit: Payer: Self-pay | Admitting: Physician Assistant

## 2017-11-06 ENCOUNTER — Telehealth: Payer: Self-pay | Admitting: Physician Assistant

## 2017-11-06 MED ORDER — LISINOPRIL 10 MG PO TABS: 10.0000 mg | ORAL_TABLET | Freq: Every day | ORAL | 0 refills | Status: DC

## 2017-11-06 NOTE — Telephone Encounter (Signed)
Pt  Has  An  Appointment scheduled  For   11/17/2017

## 2017-11-06 NOTE — Telephone Encounter (Signed)
Copied from CRM [phone]. Topic: Quick Communication - Rx Refill/Question >> Nov 06, 2017 11:29 AM Cipriano BunkerLambe, Annette S wrote:  Medication: lisinopril (PRINIVIL,ZESTRIL) 10 MG tablet Pt. Is out of medication and has appt. On 4/26 for CPE  Has the patient contacted their pharmacy? Yes.    (Agent: If no, request that the patient contact the pharmacy for the refill.) Preferred Pharmacy (with phone number or street name):   [id number] - [place], [place] - [address] [place] [id number] Phone: [phone] Fax: [phone]   Agent: Please be advised that RX refills may take up to 3 business days. We ask that you follow-up with your pharmacy.

## 2017-11-17 ENCOUNTER — Ambulatory Visit (INDEPENDENT_AMBULATORY_CARE_PROVIDER_SITE_OTHER): Payer: BLUE CROSS/BLUE SHIELD | Admitting: Physician Assistant

## 2017-11-17 ENCOUNTER — Encounter: Payer: Self-pay | Admitting: Physician Assistant

## 2017-11-17 ENCOUNTER — Other Ambulatory Visit: Payer: Self-pay

## 2017-11-17 VITALS — BP 120/82 | HR 86 | Temp 98.0°F | Resp 16 | Ht 71.0 in | Wt 260.0 lb

## 2017-11-17 DIAGNOSIS — Z0000 Encounter for general adult medical examination without abnormal findings: Secondary | ICD-10-CM | POA: Diagnosis not present

## 2017-11-17 DIAGNOSIS — I10 Essential (primary) hypertension: Secondary | ICD-10-CM | POA: Diagnosis not present

## 2017-11-17 LAB — COMPREHENSIVE METABOLIC PANEL
ALBUMIN: 4.4 g/dL (ref 3.5–5.2)
ALT: 37 U/L (ref 0–53)
AST: 21 U/L (ref 0–37)
Alkaline Phosphatase: 68 U/L (ref 39–117)
BILIRUBIN TOTAL: 0.5 mg/dL (ref 0.2–1.2)
BUN: 14 mg/dL (ref 6–23)
CALCIUM: 9.5 mg/dL (ref 8.4–10.5)
CHLORIDE: 104 meq/L (ref 96–112)
CO2: 25 mEq/L (ref 19–32)
CREATININE: 1.09 mg/dL (ref 0.40–1.50)
GFR: 80.86 mL/min (ref >60.00)
Glucose, Bld: 92 mg/dL (ref 70–99)
Potassium: 4.4 mEq/L (ref 3.5–5.1)
Sodium: 141 mEq/L (ref 135–145)
Total Protein: 6.8 g/dL (ref 6.0–8.3)

## 2017-11-17 LAB — CBC WITH DIFFERENTIAL/PLATELET
BASOS PCT: 0.3 % (ref 0.0–3.0)
Basophils Absolute: 0 10*3/uL (ref 0.0–0.1)
EOS ABS: 0.1 10*3/uL (ref 0.0–0.7)
Eosinophils Relative: 1.3 % (ref 0.0–5.0)
HEMATOCRIT: 42.5 % (ref 39.0–52.0)
Hemoglobin: 14.5 g/dL (ref 13.0–17.0)
LYMPHS PCT: 37.7 % (ref 12.0–46.0)
Lymphs Abs: 2.5 10*3/uL (ref 0.7–4.0)
MCHC: 34 g/dL (ref 30.0–36.0)
MCV: 94.9 fl (ref 78.0–100.0)
MONOS PCT: 7.7 % (ref 3.0–12.0)
Monocytes Absolute: 0.5 10*3/uL (ref 0.1–1.0)
Neutro Abs: 3.5 10*3/uL (ref 1.4–7.7)
Neutrophils Relative %: 53 % (ref 43.0–77.0)
Platelets: 350 10*3/uL (ref 150.0–400.0)
RBC: 4.48 Mil/uL (ref 4.22–5.81)
RDW: 13 % (ref 11.5–15.5)
WBC: 6.5 10*3/uL (ref 4.0–10.5)

## 2017-11-17 LAB — LIPID PANEL
CHOLESTEROL: 192 mg/dL (ref 0–200)
HDL: 43.5 mg/dL (ref >39.00)
NonHDL: 148.57
TRIGLYCERIDES: 224 mg/dL — AB (ref 0.0–149.0)
Total CHOL/HDL Ratio: 4
VLDL: 44.8 mg/dL — ABNORMAL HIGH (ref 0.0–40.0)

## 2017-11-17 LAB — HEMOGLOBIN A1C: Hgb A1c MFr Bld: 5.8 % (ref 4.6–6.5)

## 2017-11-17 LAB — LDL CHOLESTEROL, DIRECT: Direct LDL: 116 mg/dL

## 2017-11-17 NOTE — Assessment & Plan Note (Signed)
Normotensive. Asymptomatic. Continue current regimen. BMP today.

## 2017-11-17 NOTE — Assessment & Plan Note (Signed)
Depression screen negative. Health Maintenance reviewed -- Endorses Tetanus 5 years ago. Preventive schedule discussed and handout given in AVS. Will obtain fasting labs today.

## 2017-11-17 NOTE — Progress Notes (Signed)
Patient presents to clinic today to establish care. Is trying to stay active with walking. Plays golf regularly. Trying to keep a well-balanced diet.    Chronic Issues: Hypertension -- Is currently taking Lisinopril 10 mg daily. Endorses tolerating medication well. Is trying to watch diet. Patient denies chest pain, palpitations, lightheadedness, dizziness, vision changes or frequent headaches.  BP Readings from Last 3 Encounters:  11/17/17 120/82  11/15/16 (!) 138/92   Health Maintenance: Immunizations -- TDaP due. Endorses last 5 years ago.   Past Medical History:  Diagnosis Date  . History of chickenpox   . Hypertension     Past Surgical History:  Procedure Laterality Date  . CHOLECYSTECTOMY    . ORTHOPEDIC SURGERY      Current Outpatient Medications on File Prior to Visit  Medication Sig Dispense Refill  . lisinopril (PRINIVIL,ZESTRIL) 10 MG tablet Take 1 tablet (10 mg total) by mouth daily. 30 tablet 0   No current facility-administered medications on file prior to visit.     No Known Allergies  Family History  Problem Relation Age of Onset  . Healthy Mother   . Healthy Father   . Healthy Sister   . Healthy Maternal Grandmother   . Alzheimer's disease Maternal Grandfather   . Healthy Son     Social History   Socioeconomic History  . Marital status: Married    Spouse name: Not on file  . Number of children: 1  . Years of education: Not on file  . Highest education level: Not on file  Occupational History  . Not on file  Social Needs  . Financial resource strain: Not on file  . Food insecurity:    Worry: Not on file    Inability: Not on file  . Transportation needs:    Medical: Not on file    Non-medical: Not on file  Tobacco Use  . Smoking status: Former Smoker    Years: 10.00    Types: Cigarettes  . Smokeless tobacco: Never Used  Substance and Sexual Activity  . Alcohol use: Yes    Comment: 2 drinks (beer) per week  . Drug use: No  .  Sexual activity: Yes  Lifestyle  . Physical activity:    Days per week: Not on file    Minutes per session: Not on file  . Stress: Not on file  Relationships  . Social connections:    Talks on phone: Not on file    Gets together: Not on file    Attends religious service: Not on file    Active member of club or organization: Not on file    Attends meetings of clubs or organizations: Not on file    Relationship status: Not on file  . Intimate partner violence:    Fear of current or ex partner: Not on file    Emotionally abused: Not on file    Physically abused: Not on file    Forced sexual activity: Not on file  Other Topics Concern  . Not on file  Social History Narrative  . Not on file   Review of Systems  Constitutional: Negative for fever and weight loss.  HENT: Negative for ear discharge, ear pain, hearing loss and tinnitus.   Eyes: Negative for blurred vision, double vision, photophobia and pain.  Respiratory: Negative for cough and shortness of breath.   Cardiovascular: Negative for chest pain and palpitations.  Gastrointestinal: Negative for abdominal pain, blood in stool, constipation, diarrhea, heartburn, melena, nausea and vomiting.  Genitourinary: Negative for dysuria, flank pain, frequency, hematuria and urgency.  Musculoskeletal: Negative for falls.  Neurological: Negative for dizziness, loss of consciousness and headaches.  Endo/Heme/Allergies: Negative for environmental allergies.  Psychiatric/Behavioral: Negative for depression, hallucinations, substance abuse and suicidal ideas. The patient is not nervous/anxious and does not have insomnia.     BP 120/82   Pulse 86   Temp 98 F (36.7 C) (Oral)   Resp 16   Ht 5\' 11"  (1.803 m)   Wt 260 lb (117.9 kg)   SpO2 97%   BMI 36.26 kg/m   Physical Exam  Constitutional: He is oriented to person, place, and time. He appears well-developed and well-nourished. No distress.  HENT:  Head: Normocephalic and  atraumatic.  Right Ear: Tympanic membrane, external ear and ear canal normal.  Left Ear: Tympanic membrane, external ear and ear canal normal.  Nose: Nose normal.  Mouth/Throat: Oropharynx is clear and moist and mucous membranes are normal. No posterior oropharyngeal edema or posterior oropharyngeal erythema.  Eyes: Pupils are equal, round, and reactive to light. Conjunctivae are normal.  Neck: Neck supple. No thyromegaly present.  Cardiovascular: Normal rate, regular rhythm, normal heart sounds and intact distal pulses.  Pulmonary/Chest: Effort normal and breath sounds normal. No respiratory distress. He has no wheezes. He has no rales. He exhibits no tenderness.  Abdominal: Soft. Bowel sounds are normal. He exhibits no distension and no mass. There is no tenderness. There is no rebound and no guarding.  Lymphadenopathy:    He has no cervical adenopathy.  Neurological: He is alert and oriented to person, place, and time. No cranial nerve deficit.  Skin: Skin is warm and dry. No rash noted. He is not diaphoretic.  Psychiatric: He has a normal mood and affect.  Vitals reviewed.  Assessment/Plan: Visit for preventive health examination Depression screen negative. Health Maintenance reviewed -- Endorses Tetanus 5 years ago. Preventive schedule discussed and handout given in AVS. Will obtain fasting labs today.   Hypertension Normotensive. Asymptomatic. Continue current regimen. BMP today.     Piedad ClimesWilliam Cody Gianlucca Szymborski, PA-C

## 2017-11-17 NOTE — Patient Instructions (Signed)
-Please go to the lab for blood work.  -Our office will call you with your results unless you have chosen to receive results via MyChart. -If your blood work is normal we will follow-up each year for physicals and as scheduled for chronic medical problems. -If anything is abnormal we will treat accordingly and get you in for a follow-up.   Preventive Care 18-39 Years, Male Preventive care refers to lifestyle choices and visits with your health care provider that can promote health and wellness. What does preventive care include?  A yearly physical exam. This is also called an annual well check.  Dental exams once or twice a year.  Routine eye exams. Ask your health care provider how often you should have your eyes checked.  Personal lifestyle choices, including: ? Daily care of your teeth and gums. ? Regular physical activity. ? Eating a healthy diet. ? Avoiding tobacco and drug use. ? Limiting alcohol use. ? Practicing safe sex. What happens during an annual well check? The services and screenings done by your health care provider during your annual well check will depend on your age, overall health, lifestyle risk factors, and family history of disease. Counseling Your health care provider may ask you questions about your:  Alcohol use.  Tobacco use.  Drug use.  Emotional well-being.  Home and relationship well-being.  Sexual activity.  Eating habits.  Work and work environment.  Screening You may have the following tests or measurements:  Height, weight, and BMI.  Blood pressure.  Lipid and cholesterol levels. These may be checked every 5 years starting at age 20.  Diabetes screening. This is done by checking your blood sugar (glucose) after you have not eaten for a while (fasting).  Skin check.  Hepatitis C blood test.  Hepatitis B blood test.  Sexually transmitted disease (STD) testing.  Discuss your test results, treatment options, and if  necessary, the need for more tests with your health care provider. Vaccines Your health care provider may recommend certain vaccines, such as:  Influenza vaccine. This is recommended every year.  Tetanus, diphtheria, and acellular pertussis (Tdap, Td) vaccine. You may need a Td booster every 10 years.  Varicella vaccine. You may need this if you have not been vaccinated.  HPV vaccine. If you are 26 or younger, you may need three doses over 6 months.  Measles, mumps, and rubella (MMR) vaccine. You may need at least one dose of MMR.You may also need a second dose.  Pneumococcal 13-valent conjugate (PCV13) vaccine. You may need this if you have certain conditions and have not been vaccinated.  Pneumococcal polysaccharide (PPSV23) vaccine. You may need one or two doses if you smoke cigarettes or if you have certain conditions.  Meningococcal vaccine. One dose is recommended if you are age 19-21 years and a first-year college student living in a residence hall, or if you have one of several medical conditions. You may also need additional booster doses.  Hepatitis A vaccine. You may need this if you have certain conditions or if you travel or work in places where you may be exposed to hepatitis A.  Hepatitis B vaccine. You may need this if you have certain conditions or if you travel or work in places where you may be exposed to hepatitis B.  Haemophilus influenzae type b (Hib) vaccine. You may need this if you have certain risk factors.  Talk to your health care provider about which screenings and vaccines you need and how often you   need them. This information is not intended to replace advice given to you by your health care provider. Make sure you discuss any questions you have with your health care provider. Document Released: 09/06/2001 Document Revised: 03/30/2016 Document Reviewed: 05/12/2015 Elsevier Interactive Patient Education  2018 [employer] Inc[employer] .       

## 2017-11-21 ENCOUNTER — Encounter: Payer: Self-pay | Admitting: Emergency Medicine

## 2017-11-27 ENCOUNTER — Telehealth: Payer: Self-pay | Admitting: Physician Assistant

## 2017-11-27 NOTE — Telephone Encounter (Signed)
Copied from CRM [id number]. Topic: Quick Communication - See Telephone Encounter >> Nov 27, 2017  2:18 PM Diana Eves B wrote: CRM for notification. See Telephone encounter for: 11/27/17. Pt is calling about lab results from 11/17/17.

## 2017-11-27 NOTE — Telephone Encounter (Signed)
Called patient and spoke with him to go through lab results.   Patient stated verbal understanding.  She states that he has a form for his job that he is going to drop off for his physical.

## 2017-12-06 ENCOUNTER — Ambulatory Visit (INDEPENDENT_AMBULATORY_CARE_PROVIDER_SITE_OTHER): Payer: BLUE CROSS/BLUE SHIELD | Admitting: Emergency Medicine

## 2017-12-06 DIAGNOSIS — Z23 Encounter for immunization: Secondary | ICD-10-CM | POA: Diagnosis not present

## 2017-12-06 NOTE — Progress Notes (Signed)
PPD Placement note Erik Velasquez, 37 y.o. 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 No:  Is patient taking any oral or IV steroid medication now or have they taken it in the last month? no Has the patient ever received the BCG vaccine?: no Has the patient been in recent contact with anyone known or suspected of having active TB disease?: no O: Alert and oriented in NAD. P:  PPD placed on 12/06/2017.On the Left Forearm  Patient advised to return for reading within 48-72 hours.

## 2017-12-15 ENCOUNTER — Other Ambulatory Visit: Payer: Self-pay | Admitting: Physician Assistant

## 2018-04-27 ENCOUNTER — Encounter: Payer: Self-pay | Admitting: Physician Assistant

## 2018-04-27 ENCOUNTER — Other Ambulatory Visit: Payer: Self-pay

## 2018-04-27 ENCOUNTER — Ambulatory Visit: Payer: BLUE CROSS/BLUE SHIELD | Admitting: Physician Assistant

## 2018-04-27 VITALS — BP 130/90 | HR 102 | Temp 98.8°F | Resp 14 | Ht 71.0 in | Wt 253.0 lb

## 2018-04-27 DIAGNOSIS — K219 Gastro-esophageal reflux disease without esophagitis: Secondary | ICD-10-CM

## 2018-04-27 MED ORDER — PANTOPRAZOLE SODIUM 40 MG PO TBEC: 40.0000 mg | DELAYED_RELEASE_TABLET | Freq: Every day | ORAL | 3 refills | Status: DC

## 2018-04-27 NOTE — Patient Instructions (Signed)
Please keep well-hydrated and get plenty of rest.  Start the Pantoprazole, taking daily as directed. Follow the dietary recommendations below. No eating 1 hour before bed. Elevate the head of your bed.  We will follow-up in 2 weeks for reassessment.    Food Choices for Gastroesophageal Reflux Disease, Adult When you have gastroesophageal reflux disease (GERD), the foods you eat and your eating habits are very important. Choosing the right foods can help ease your discomfort. What guidelines do I need to follow?  Choose fruits, vegetables, whole grains, and low-fat dairy products.  Choose low-fat meat, fish, and poultry.  Limit fats such as oils, salad dressings, butter, nuts, and avocado.  Keep a food diary. This helps you identify foods that cause symptoms.  Avoid foods that cause symptoms. These may be different for everyone.  Eat small meals often instead of 3 large meals a day.  Eat your meals slowly, in a place where you are relaxed.  Limit fried foods.  Cook foods using methods other than frying.  Avoid drinking alcohol.  Avoid drinking large amounts of liquids with your meals.  Avoid bending over or lying down until 2-3 hours after eating. What foods are not recommended? These are some foods and drinks that may make your symptoms worse: Vegetables Tomatoes. Tomato juice. Tomato and spaghetti sauce. Chili peppers. Onion and garlic. Horseradish. Fruits Oranges, grapefruit, and lemon (fruit and juice). Meats High-fat meats, fish, and poultry. This includes hot dogs, ribs, ham, sausage, salami, and bacon. Dairy Whole milk and chocolate milk. Sour cream. Cream. Butter. Ice cream. Cream cheese. Drinks Coffee and tea. Bubbly (carbonated) drinks or energy drinks. Condiments Hot sauce. Barbecue sauce. Sweets/Desserts Chocolate and cocoa. Donuts. Peppermint and spearmint. Fats and Oils High-fat foods. This includes [place] fries and potato chips. Other Vinegar.  Strong spices. This includes black pepper, white pepper, red pepper, cayenne, curry powder, cloves, ginger, and chili powder. The items listed above may not be a complete list of foods and drinks to avoid. Contact your dietitian for more information. This information is not intended to replace advice given to you by your health care provider. Make sure you discuss any questions you have with your health care provider. Document Released: 01/10/2012 Document Revised: 12/17/2015 Document Reviewed: 05/15/2013 Elsevier Interactive Patient Education  2017 [employer].

## 2018-04-27 NOTE — Progress Notes (Signed)
Patient presents to clinic today c/o noted reflux, worse in the morning with nausea, sour taste in the mouth with dry cough and globus sensation. Was taking Zantac without any improvement so he stopped this. Denies abdominal pain, change in bowel habits, melena, hematochezia or tenesmus. Has been keeping a bland diet and avoiding late-night eating.   Past Medical History:  Diagnosis Date  . History of chickenpox   . Hypertension     Current Outpatient Medications on File Prior to Visit  Medication Sig Dispense Refill  . lisinopril (PRINIVIL,ZESTRIL) 10 MG tablet TAKE 1 TABLET BY MOUTH DAILY 90 tablet 1   No current facility-administered medications on file prior to visit.     No Known Allergies  Family History  Problem Relation Age of Onset  . Healthy Mother   . Healthy Father   . Healthy Sister   . Healthy Maternal Grandmother   . Alzheimer's disease Maternal Grandfather   . Healthy Son     Social History   Socioeconomic History  . Marital status: Married    Spouse name: Not on file  . Number of children: 1  . Years of education: Not on file  . Highest education level: Not on file  Occupational History  . Not on file  Social Needs  . Financial resource strain: Not on file  . Food insecurity:    Worry: Not on file    Inability: Not on file  . Transportation needs:    Medical: Not on file    Non-medical: Not on file  Tobacco Use  . Smoking status: Former Smoker    Years: 10.00    Types: Cigarettes  . Smokeless tobacco: Never Used  Substance and Sexual Activity  . Alcohol use: Yes    Comment: 2 drinks (beer) per week  . Drug use: No  . Sexual activity: Yes  Lifestyle  . Physical activity:    Days per week: Not on file    Minutes per session: Not on file  . Stress: Not on file  Relationships  . Social connections:    Talks on phone: Not on file    Gets together: Not on file    Attends religious service: Not on file    Active member of club or  organization: Not on file    Attends meetings of clubs or organizations: Not on file    Relationship status: Not on file  Other Topics Concern  . Not on file  Social History Narrative  . Not on file   Review of Systems - See HPI.  All other ROS are negative.  BP 130/90   Pulse (!) 102   Temp 98.8 F (37.1 C) (Oral)   Resp 14   Ht 5\' 11"  (1.803 m)   Wt 253 lb (114.8 kg)   SpO2 99%   BMI 35.29 kg/m   Physical Exam  Constitutional: He is oriented to person, place, and time. He appears well-developed and well-nourished.  HENT:  Head: Normocephalic and atraumatic.  Eyes: Conjunctivae are normal.  Cardiovascular: Normal rate, regular rhythm, normal heart sounds and intact distal pulses.  Pulmonary/Chest: Effort normal and breath sounds normal.  Abdominal: Soft. Bowel sounds are normal. He exhibits no distension. There is no tenderness.  Neurological: He is alert and oriented to person, place, and time.  Vitals reviewed.  Assessment/Plan: 1. Gastroesophageal reflux disease without esophagitis Start PPI - Rx sent for Protonix. Take daily. Restart GERD diet. Close follow-up scheduled. Encouraged diet and weight loss.  -  pantoprazole (PROTONIX) 40 MG tablet; Take 1 tablet (40 mg total) by mouth daily.  Dispense: 30 tablet; Refill: 3   Piedad Climes, [place]

## 2018-09-03 ENCOUNTER — Other Ambulatory Visit: Payer: Self-pay | Admitting: Physician Assistant

## 2018-09-30 ENCOUNTER — Other Ambulatory Visit: Payer: Self-pay | Admitting: Physician Assistant

## 2018-09-30 DIAGNOSIS — K219 Gastro-esophageal reflux disease without esophagitis: Secondary | ICD-10-CM

## 2018-11-19 ENCOUNTER — Encounter: Payer: BLUE CROSS/BLUE SHIELD | Admitting: Physician Assistant

## 2018-12-24 ENCOUNTER — Encounter: Payer: BLUE CROSS/BLUE SHIELD | Admitting: Physician Assistant

## 2019-04-24 ENCOUNTER — Ambulatory Visit (INDEPENDENT_AMBULATORY_CARE_PROVIDER_SITE_OTHER): Payer: BC Managed Care – PPO | Admitting: Physician Assistant

## 2019-04-24 ENCOUNTER — Other Ambulatory Visit: Payer: Self-pay

## 2019-04-24 ENCOUNTER — Encounter: Payer: Self-pay | Admitting: Physician Assistant

## 2019-04-24 DIAGNOSIS — B9689 Other specified bacterial agents as the cause of diseases classified elsewhere: Secondary | ICD-10-CM

## 2019-04-24 DIAGNOSIS — Z20828 Contact with and (suspected) exposure to other viral communicable diseases: Secondary | ICD-10-CM

## 2019-04-24 DIAGNOSIS — J208 Acute bronchitis due to other specified organisms: Secondary | ICD-10-CM | POA: Diagnosis not present

## 2019-04-24 MED ORDER — DOXYCYCLINE HYCLATE 100 MG PO TABS: 100.0000 mg | ORAL_TABLET | Freq: Two times a day (BID) | ORAL | 0 refills | Status: DC

## 2019-04-24 MED ORDER — PREDNISONE 20 MG PO TABS: 40.0000 mg | ORAL_TABLET | Freq: Every day | ORAL | 0 refills | Status: DC

## 2019-04-24 MED ORDER — BENZONATATE 100 MG PO CAPS: 100.0000 mg | ORAL_CAPSULE | Freq: Two times a day (BID) | ORAL | 0 refills | Status: DC | PRN

## 2019-04-24 NOTE — Progress Notes (Signed)
   Virtual Visit via Video   I connected with patient on 04/24/19 at  4:00 PM EDT by a video enabled telemedicine application and verified that I am speaking with the correct person using two identifiers.  Location patient: Home Location provider: [facility], Office Persons participating in the virtual visit: Patient, Provider, Erik Park (Patina Moore)  I discussed the limitations of evaluation and management by telemedicine and the availability of in person appointments. The patient expressed understanding and agreed to proceed.  Subjective:   HPI:   Patient presents via D[employer]Me today c/o 3 weeks of URI symptoms including chest congestion, nasal congestion, cough productive of green sputum (initially clear), subjective fevers at onset. Denies sinus pain, headaches, ear pain or tooth pain. Denies chest pain. Notes intermittent chest tightness and wheezing. Denies recent travel. Wife with similar symptoms, lasted about a month. Completely resolved. Was not tested for COVID.   Has taken OTC cough syrup, Mucinex and Tylenol.      ROS:   See pertinent positives and negatives per HPI.  Patient Active Problem List   Diagnosis Date Noted  . Visit for preventive health examination 11/17/2017  . Hypertension 11/15/2016  . S/P cholecystectomy 11/15/2016    Social History   Tobacco Use  . Smoking status: Former Smoker    Years: 10.00    Types: Cigarettes  . Smokeless tobacco: Never Used  Substance Use Topics  . Alcohol use: Yes    Comment: 2 drinks (beer) per week    Current Outpatient Medications:  .  lisinopril (PRINIVIL,ZESTRIL) 10 MG tablet, TAKE 1 TABLET BY MOUTH DAILY, Disp: 90 tablet, Rfl: 1 .  pantoprazole (PROTONIX) 40 MG tablet, TAKE 1 TABLET(40 MG) BY MOUTH DAILY, Disp: 30 tablet, Rfl: 3  No Known Allergies  Objective:   There were no vitals taken for this visit.  Patient is well-developed, well-nourished in no acute distress.  Resting comfortably at home.   Head is normocephalic, atraumatic.  No labored breathing.  Speech is clear and coherent with logical content.  Patient is alert and oriented at baseline.   Assessment and Plan:   1. Acute bacterial bronchitis Rx Doxycycline.  Increase fluids.  Rest.  Saline nasal spray.  Probiotic.  Mucinex as directed.  Humidifier in bedroom. Tessalon per orders. Will give prednisone burst due to chest tightness and wheeze. He is getting COVID screened to be on the safe side. Quarantining until results are in.  Call or return to clinic if symptoms are not improving.     Erik Rio, PA-C 04/24/2019

## 2019-04-24 NOTE — Progress Notes (Signed)
I have discussed the procedure for the virtual visit with the patient who has given consent to proceed with assessment and treatment.   Erik Velasquez S Jabin Tapp, CMA     

## 2019-04-24 NOTE — Patient Instructions (Signed)
Instructions sent.   Take antibiotic (Doxycycline) as directed.  Increase fluids.  Get plenty of rest. Use Mucinex for congestion. The Tessalon will help with your cough. Take the prednisone course as directed. Take a daily probiotic (I recommend Align or Culturelle, but even Activia Yogurt may be beneficial).  A humidifier placed in the bedroom may offer some relief for a dry, scratchy throat of nasal irritation.  Read information below on acute bronchitis. Please call or return to clinic if symptoms are not improving.  Again COVID testing is at [place], [place].  Acute Bronchitis Bronchitis is when the airways that extend from the windpipe into the lungs get red, puffy, and painful (inflamed). Bronchitis often causes thick spit (mucus) to develop. This leads to a cough. A cough is the most common symptom of bronchitis. In acute bronchitis, the condition usually begins suddenly and goes away over time (usually in 2 weeks). Smoking, allergies, and asthma can make bronchitis worse. Repeated episodes of bronchitis may cause more lung problems.  HOME CARE  Rest.  Drink enough fluids to keep your pee (urine) clear or pale yellow (unless you need to limit fluids as told by your doctor).  Only take over-the-counter or prescription medicines as told by your doctor.  Avoid smoking and secondhand smoke. These can make bronchitis worse. If you are a smoker, think about using nicotine gum or skin patches. Quitting smoking will help your lungs heal faster.  Reduce the chance of getting bronchitis again by:  Washing your hands often.  Avoiding people with cold symptoms.  Trying not to touch your hands to your mouth, nose, or eyes.  Follow up with your doctor as told.  GET HELP IF: Your symptoms do not improve after 1 week of treatment. Symptoms include:  Cough.  Fever.  Coughing up thick spit.  Body aches.  Chest congestion.  Chills.  Shortness of breath.  Sore  throat.  GET HELP RIGHT AWAY IF:   You have an increased fever.  You have chills.  You have severe shortness of breath.  You have bloody thick spit (sputum).  You throw up (vomit) often.  You lose too much body fluid (dehydration).  You have a severe headache.  You faint.  MAKE SURE YOU:   Understand these instructions.  Will watch your condition.  Will get help right away if you are not doing well or get worse. Document Released: 12/28/2007 Document Revised: 03/13/2013 Document Reviewed: 01/01/2013 [facility] Patient Information 2015 [place], [place]. This information is not intended to replace advice given to you by your health care provider. Make sure you discuss any questions you have with your health care provider.

## 2019-06-28 DIAGNOSIS — Z1159 Encounter for screening for other viral diseases: Secondary | ICD-10-CM | POA: Diagnosis not present

## 2019-07-05 DIAGNOSIS — Z1159 Encounter for screening for other viral diseases: Secondary | ICD-10-CM | POA: Diagnosis not present

## 2019-07-12 DIAGNOSIS — Z1159 Encounter for screening for other viral diseases: Secondary | ICD-10-CM | POA: Diagnosis not present

## 2019-07-24 DIAGNOSIS — Z03818 Encounter for observation for suspected exposure to other biological agents ruled out: Secondary | ICD-10-CM | POA: Diagnosis not present

## 2019-08-13 DIAGNOSIS — Z20828 Contact with and (suspected) exposure to other viral communicable diseases: Secondary | ICD-10-CM | POA: Diagnosis not present

## 2019-11-01 ENCOUNTER — Other Ambulatory Visit: Payer: Self-pay | Admitting: Emergency Medicine

## 2019-11-01 DIAGNOSIS — I10 Essential (primary) hypertension: Secondary | ICD-10-CM

## 2019-11-01 MED ORDER — LISINOPRIL 10 MG PO TABS
10.0000 mg | ORAL_TABLET | Freq: Every day | ORAL | 0 refills | Status: DC
Start: 2019-11-01 — End: 2020-01-15

## 2020-01-15 ENCOUNTER — Other Ambulatory Visit: Payer: Self-pay

## 2020-01-15 ENCOUNTER — Telehealth (INDEPENDENT_AMBULATORY_CARE_PROVIDER_SITE_OTHER): Payer: BC Managed Care – PPO | Admitting: Physician Assistant

## 2020-01-15 ENCOUNTER — Encounter: Payer: Self-pay | Admitting: Physician Assistant

## 2020-01-15 DIAGNOSIS — K219 Gastro-esophageal reflux disease without esophagitis: Secondary | ICD-10-CM

## 2020-01-15 DIAGNOSIS — I10 Essential (primary) hypertension: Secondary | ICD-10-CM

## 2020-01-15 MED ORDER — PANTOPRAZOLE SODIUM 40 MG PO TBEC: DELAYED_RELEASE_TABLET | ORAL | 3 refills | Status: DC

## 2020-01-15 MED ORDER — LISINOPRIL 10 MG PO TABS: 10.0000 mg | ORAL_TABLET | Freq: Every day | ORAL | 2 refills | Status: DC

## 2020-01-15 NOTE — Progress Notes (Signed)
   Virtual Visit via Video   I connected with patient on 01/15/20 at  3:30 PM EDT by a video enabled telemedicine application and verified that I am speaking with the correct person using two identifiers.  Location patient: Home Location provider: [place] April, Office Persons participating in the virtual visit: Patient, Provider, CMA (Patina Moore)  I discussed the limitations of evaluation and management by telemedicine and the availability of in person appointments. The patient expressed understanding and agreed to proceed.  Subjective:   HPI:   Patient presents via Care[facility] today to follow-up regarding hypertension and GERD.   Hypertension -- patient currently on a regimen of lisinopril 10 mg daily. Endorses taking as directed, still tolerating well without noted side effect. Patient denies chest pain, palpitations, lightheadedness, dizziness, vision changes or frequent headaches.  GERD -- patient currently on a regimen of protonix. Has only been taking on occasion. Notes it helps mostly when he takes but is having daily heartburn symptoms. Denies abdominal pain, nausea/vomiting or change to bowel habits.  ROS:   See pertinent positives and negatives per HPI.  Patient Active Problem List   Diagnosis Date Noted  . Visit for preventive health examination 11/17/2017  . Hypertension 11/15/2016  . S/P cholecystectomy 11/15/2016    Social History   Tobacco Use  . Smoking status: Former Smoker    Years: 10.00    Types: Cigarettes  . Smokeless tobacco: Never Used  Substance Use Topics  . Alcohol use: Yes    Comment: 2 drinks (beer) per week    Current Outpatient Medications:  .  lisinopril (ZESTRIL) 10 MG tablet, Take 1 tablet (10 mg total) by mouth daily. Patient overdue for appointment. Please call the office to schedule appointment, Disp: 30 tablet, Rfl: 0 .  pantoprazole (PROTONIX) 40 MG tablet, TAKE 1 TABLET(40 MG) BY MOUTH DAILY, Disp: 30 tablet, Rfl: 3  No  Known Allergies  Objective:   There were no vitals taken for this visit.  Patient is well-developed, well-nourished in no acute distress.  Resting comfortably at home.  Head is normocephalic, atraumatic.  No labored breathing.  Speech is clear and coherent with logical content.  Patient is alert and oriented at baseline.   Assessment and Plan:   1. Essential hypertension Home BP normotensive. Patient asymptomatic. Continue lisinopril at current dose along with DASH diet. Patient to schedule CPE as last was in 2019 and routine labs need updated to further risk stratify for CVD. Patient agrees to schedule.  - lisinopril (ZESTRIL) 10 MG tablet; Take 1 tablet (10 mg total) by mouth daily. Patient overdue for appointment. Please call the office to schedule appointment  Dispense: 30 tablet; Refill: 2  2. Gastroesophageal reflux disease without esophagitis Patient encouraged to take PPI daily over the next 2 weeks instead of PRN to see if he achieves complete control of symptoms. GERD diet reviewed. Follow-up scheduled.  - pantoprazole (PROTONIX) 40 MG tablet; TAKE 1 TABLET(40 MG) BY MOUTH DAILY  Dispense: 30 tablet; Refill: 3    Piedad Climes, [place] 01/15/2020

## 2020-04-16 ENCOUNTER — Telehealth: Payer: Self-pay | Admitting: Physician Assistant

## 2020-04-16 DIAGNOSIS — K219 Gastro-esophageal reflux disease without esophagitis: Secondary | ICD-10-CM

## 2020-04-16 DIAGNOSIS — I10 Essential (primary) hypertension: Secondary | ICD-10-CM

## 2020-04-16 MED ORDER — PANTOPRAZOLE SODIUM 40 MG PO TBEC: DELAYED_RELEASE_TABLET | ORAL | 0 refills | Status: DC

## 2020-04-16 MED ORDER — LISINOPRIL 10 MG PO TABS: 10.0000 mg | ORAL_TABLET | Freq: Every day | ORAL | 0 refills | Status: DC

## 2020-04-16 NOTE — Telephone Encounter (Signed)
.   LAST APPOINTMENT DATE: Visit date not found   NEXT APPOINTMENT DATE:@Visit  date not found  MEDICATION:lisinopril (ZESTRIL) 10 MG tablet  pantoprazole (PROTONIX) 40 MG tablet  PHARMACY: [id number] - [place], [place] - [address]  **Let patient know to contact pharmacy at the end of the day to make sure medication is ready. **  ** Please notify patient to allow 48-72 hours to process**  **Encourage patient to contact the pharmacy for refills or they can request refills through [facility]**  CLINICAL FILLS OUT ALL BELOW:   LAST REFILL:  QTY:  REFILL DATE:    OTHER COMMENTS:    Okay for refill?  Please advise

## 2020-11-27 ENCOUNTER — Telehealth: Payer: Self-pay

## 2020-11-27 ENCOUNTER — Other Ambulatory Visit: Payer: Self-pay

## 2020-11-27 DIAGNOSIS — I10 Essential (primary) hypertension: Secondary | ICD-10-CM

## 2020-11-27 MED ORDER — LISINOPRIL 10 MG PO TABS: 10.0000 mg | ORAL_TABLET | Freq: Every day | ORAL | 1 refills | Status: DC

## 2020-11-27 NOTE — Telephone Encounter (Signed)
  LAST APPOINTMENT DATE:01/15/2020   NEXT APPOINTMENT DATE:@7 /15/2022  MEDICATION:lisinopril (ZESTRIL) 10 MG tablet  PHARMACY:[id number] - [place], [place] - [address]  Comments: Patient is completely out.   Please advise

## 2020-11-27 NOTE — Telephone Encounter (Signed)
Sent to pharmacy 

## 2020-11-30 ENCOUNTER — Other Ambulatory Visit: Payer: Self-pay

## 2020-11-30 DIAGNOSIS — I10 Essential (primary) hypertension: Secondary | ICD-10-CM

## 2020-11-30 MED ORDER — LISINOPRIL 10 MG PO TABS: 10.0000 mg | ORAL_TABLET | Freq: Every day | ORAL | 0 refills | Status: DC

## 2021-02-05 ENCOUNTER — Encounter: Payer: BC Managed Care – PPO | Admitting: Registered Nurse

## 2021-03-05 ENCOUNTER — Other Ambulatory Visit: Payer: Self-pay

## 2021-03-05 ENCOUNTER — Telehealth: Payer: Self-pay

## 2021-03-05 ENCOUNTER — Other Ambulatory Visit: Payer: Self-pay | Admitting: Family

## 2021-03-05 DIAGNOSIS — I10 Essential (primary) hypertension: Secondary | ICD-10-CM

## 2021-03-05 MED ORDER — LISINOPRIL 10 MG PO TABS: 10.0000 mg | ORAL_TABLET | Freq: Every day | ORAL | 0 refills | Status: DC

## 2021-03-05 NOTE — Telephone Encounter (Signed)
Pt has TOC on 09/07 with Gerlene Burdock is there anyway that his blood pressure meds can get approved until then?    lisinopril (ZESTRIL) 10 MG tablet [id number] - [place], [place] - [address]    Pt call back [phone]

## 2021-03-05 NOTE — Telephone Encounter (Signed)
A 30 day supply sent to pharmacy. 

## 2021-03-31 ENCOUNTER — Encounter: Payer: Self-pay | Admitting: Registered Nurse

## 2021-03-31 ENCOUNTER — Ambulatory Visit: Payer: BC Managed Care – PPO | Admitting: Registered Nurse

## 2021-03-31 ENCOUNTER — Other Ambulatory Visit: Payer: Self-pay

## 2021-03-31 VITALS — BP 150/100 | HR 73 | Temp 98.1°F | Resp 20 | Ht 71.0 in | Wt 248.6 lb

## 2021-03-31 DIAGNOSIS — K219 Gastro-esophageal reflux disease without esophagitis: Secondary | ICD-10-CM

## 2021-03-31 DIAGNOSIS — I10 Essential (primary) hypertension: Secondary | ICD-10-CM

## 2021-03-31 DIAGNOSIS — F17200 Nicotine dependence, unspecified, uncomplicated: Secondary | ICD-10-CM | POA: Diagnosis not present

## 2021-03-31 MED ORDER — PANTOPRAZOLE SODIUM 40 MG PO TBEC: DELAYED_RELEASE_TABLET | ORAL | 1 refills | Status: DC

## 2021-03-31 MED ORDER — BUPROPION HCL ER (SR) 150 MG PO TB12: 150.0000 mg | ORAL_TABLET | Freq: Two times a day (BID) | ORAL | 0 refills | Status: DC

## 2021-03-31 MED ORDER — LISINOPRIL 20 MG PO TABS: 20.0000 mg | ORAL_TABLET | Freq: Every day | ORAL | 0 refills | Status: DC

## 2021-03-31 NOTE — Progress Notes (Signed)
Established Patient Office Visit  Subjective:  Patient ID: Erik Velasquez, male    DOB: [birth year]  Age: 40 y.o. MRN: [id number]  CC:  Chief Complaint  Patient presents with   Transitions Of Care    HPI Erik Velasquez presents for visit to est care  No acute concerns  Histories reviewed and updated with patient.   Hypertension: Patient Currently taking: lisinopril 10mg  po qd Good effect. No AEs. Denies CV symptoms including: chest pain, shob, doe, headache, visual changes, fatigue, claudication, and dependent edema.   Previous readings and labs: BP Readings from Last 3 Encounters:  03/31/21 (!) 150/100  04/27/18 130/90  11/17/17 120/82   Lab Results  Component Value Date   CREATININE 1.09 11/17/2017   Does acknowledge that his smoking likely drives up BP. Hoping to quit  GERD Uses pantoprazole 40mg  po qd as needed Good effect, no AE Has run out, using OTC nexium, same effect Knows to avoid trigger foods   Smoking Interested in cessation Has quit before Has not used medication No hx of allergies or intolerances to any smoking cessation medications  Past Medical History:  Diagnosis Date   History of chickenpox    Hypertension     Past Surgical History:  Procedure Laterality Date   CHOLECYSTECTOMY     ORTHOPEDIC SURGERY  2012   broken leg    Family History  Problem Relation Age of Onset   Healthy Mother    Healthy Father    Healthy Sister    Healthy Maternal Grandmother    Alzheimer's disease Maternal Grandfather    Healthy Son     Social History   Socioeconomic History   Marital status: Married    Spouse name: Not on file   Number of children: 1   Years of education: Not on file   Highest education level: Not on file  Occupational History   Not on file  Tobacco Use   Smoking status: Every Day    Packs/day: 0.50    Years: 12.00    Pack years: 6.00    Types: Cigarettes   Smokeless tobacco: Never  Vaping Use   Vaping  Use: Never used  Substance and Sexual Activity   Alcohol use: Yes    Comment: 2 drinks (beer) per week   Drug use: No   Sexual activity: Yes  Other Topics Concern   Not on file  Social History Narrative   Not on file   Social Determinants of Health   Financial Resource Strain: Not on file  Food Insecurity: Not on file  Transportation Needs: Not on file  Physical Activity: Not on file  Stress: Not on file  Social Connections: Not on file  Intimate Partner Violence: Not on file    Outpatient Medications Prior to Visit  Medication Sig Dispense Refill   lisinopril (ZESTRIL) 10 MG tablet Take 1 tablet (10 mg total) by mouth daily. 30 tablet 0   pantoprazole (PROTONIX) 40 MG tablet TAKE 1 TABLET(40 MG) BY MOUTH DAILY (Patient not taking: Reported on 03/31/2021) 30 tablet 0   No facility-administered medications prior to visit.    No Known Allergies  ROS Review of Systems  Constitutional: Negative.   HENT: Negative.    Eyes: Negative.   Respiratory: Negative.    Cardiovascular: Negative.   Gastrointestinal: Negative.   Genitourinary: Negative.   Musculoskeletal: Negative.   Skin: Negative.   Neurological: Negative.   Psychiatric/Behavioral: Negative.    All other systems reviewed and are  negative.    Objective:    Physical Exam Constitutional:      General: He is not in acute distress.    Appearance: Normal appearance. He is normal weight. He is not ill-appearing, toxic-appearing or diaphoretic.  Cardiovascular:     Rate and Rhythm: Normal rate and regular rhythm.     Heart sounds: Normal heart sounds. No murmur heard.   No friction rub. No gallop.  Pulmonary:     Effort: Pulmonary effort is normal. No respiratory distress.     Breath sounds: Normal breath sounds. No stridor. No wheezing, rhonchi or rales.  Chest:     Chest wall: No tenderness.  Neurological:     General: No focal deficit present.     Mental Status: He is alert and oriented to person, place,  and time. Mental status is at baseline.  Psychiatric:        Mood and Affect: Mood normal.        Behavior: Behavior normal.        Thought Content: Thought content normal.        Judgment: Judgment normal.    BP (!) 150/100   Pulse 73   Temp 98.1 F (36.7 C) (Temporal)   Resp 20   Ht 5\' 11"  (1.803 m)   Wt 248 lb 9.6 oz (112.8 kg)   SpO2 99%   BMI 34.67 kg/m  Wt Readings from Last 3 Encounters:  03/31/21 248 lb 9.6 oz (112.8 kg)  04/27/18 253 lb (114.8 kg)  11/17/17 260 lb (117.9 kg)     Health Maintenance Due  Topic Date Due   TETANUS/TDAP  Never done    There are no preventive care reminders to display for this patient.  No results found for: TSH Lab Results  Component Value Date   WBC 6.5 11/17/2017   HGB 14.5 11/17/2017   HCT 42.5 11/17/2017   MCV 94.9 11/17/2017   PLT 350.0 11/17/2017   Lab Results  Component Value Date   NA 141 11/17/2017   K 4.4 11/17/2017   CO2 25 11/17/2017   GLUCOSE 92 11/17/2017   BUN 14 11/17/2017   CREATININE 1.09 11/17/2017   BILITOT 0.5 11/17/2017   ALKPHOS 68 11/17/2017   AST 21 11/17/2017   ALT 37 11/17/2017   PROT 6.8 11/17/2017   ALBUMIN 4.4 11/17/2017   CALCIUM 9.5 11/17/2017   GFR 80.86 11/17/2017   Lab Results  Component Value Date   CHOL 192 11/17/2017   Lab Results  Component Value Date   HDL 43.50 11/17/2017   No results found for: [facility] Lab Results  Component Value Date   TRIG 224.0 (H) 11/17/2017   Lab Results  Component Value Date   CHOLHDL 4 11/17/2017   Lab Results  Component Value Date   HGBA1C 5.8 11/17/2017      Assessment & Plan:   Problem List Items Addressed This Visit   None Visit Diagnoses     Smoker    -  Primary   Relevant Medications   buPROPion (WELLBUTRIN SR) 150 MG 12 hr tablet   Gastroesophageal reflux disease without esophagitis       Relevant Medications   pantoprazole (PROTONIX) 40 MG tablet   Essential hypertension       Relevant Medications   lisinopril  (ZESTRIL) 20 MG tablet       Meds ordered this encounter  Medications   buPROPion (WELLBUTRIN SR) 150 MG 12 hr tablet    Sig: Take 1 tablet (  150 mg total) by mouth 2 (two) times daily.    Dispense:  180 tablet    Refill:  0    Order Specific Question:   Supervising Provider    Answer:   Neva Seat, JEFFREY R [[id number]]   pantoprazole (PROTONIX) 40 MG tablet    Sig: TAKE 1 TABLET(40 MG) BY MOUTH DAILY    Dispense:  90 tablet    Refill:  1    Order Specific Question:   Supervising Provider    Answer:   Neva Seat, JEFFREY R [[id number]]   lisinopril (ZESTRIL) 20 MG tablet    Sig: Take 1 tablet (20 mg total) by mouth daily.    Dispense:  90 tablet    Refill:  0    Order Specific Question:   Supervising Provider    Answer:   Neva Seat, JEFFREY R [[id number]]    Follow-up: Return in about 3 months (around 06/30/2021) for CPE and labs.   PLAN Pt declines labs today. Reviewed risks of this, patient voices understanding Start wellbutrin 150mg  XR po qd, if tolerated well, can increase to bid after 2-3 weeks. Continue to cut down on cigarettes Increase lisinopril to 20mg  po qd as bp elevated today. Hopeful that smoking cessation will further improve BP Refill pantoprazole Return in 3 mo for CPE, labs, med check Patient encouraged to call clinic with any questions, comments, or concerns.   , NP

## 2021-03-31 NOTE — Patient Instructions (Signed)
Mr. Erik Velasquez to meet you   In brief:  Start wellbutrin once a day. Then increase to twice daily after 1-2 weeks. Start taking two lisinopril a day. I have sent increased dose. Continue pantoprazole. No need to take with nexium as they are in the same class.   See you in 3 months - fasting if possible for labs  Thank you  Rich

## 2021-05-03 ENCOUNTER — Ambulatory Visit: Payer: BC Managed Care – PPO | Admitting: Family Medicine

## 2021-05-03 ENCOUNTER — Other Ambulatory Visit: Payer: Self-pay

## 2021-05-03 VITALS — BP 128/78 | HR 97 | Temp 98.0°F | Resp 16 | Ht 71.0 in | Wt 238.8 lb

## 2021-05-03 DIAGNOSIS — R1114 Bilious vomiting: Secondary | ICD-10-CM | POA: Diagnosis not present

## 2021-05-03 DIAGNOSIS — F419 Anxiety disorder, unspecified: Secondary | ICD-10-CM

## 2021-05-03 DIAGNOSIS — K30 Functional dyspepsia: Secondary | ICD-10-CM | POA: Diagnosis not present

## 2021-05-03 DIAGNOSIS — K219 Gastro-esophageal reflux disease without esophagitis: Secondary | ICD-10-CM | POA: Diagnosis not present

## 2021-05-03 DIAGNOSIS — G4700 Insomnia, unspecified: Secondary | ICD-10-CM

## 2021-05-03 DIAGNOSIS — R634 Abnormal weight loss: Secondary | ICD-10-CM | POA: Diagnosis not present

## 2021-05-03 DIAGNOSIS — F1010 Alcohol abuse, uncomplicated: Secondary | ICD-10-CM

## 2021-05-03 DIAGNOSIS — D539 Nutritional anemia, unspecified: Secondary | ICD-10-CM | POA: Diagnosis not present

## 2021-05-03 DIAGNOSIS — Y901 Blood alcohol level of 20-39 mg/100 ml: Secondary | ICD-10-CM | POA: Diagnosis not present

## 2021-05-03 LAB — CBC
HCT: 41.7 % (ref 39.0–52.0)
Hemoglobin: 13.9 g/dL (ref 13.0–17.0)
MCHC: 33.3 g/dL (ref 30.0–36.0)
MCV: 102.4 fl — ABNORMAL HIGH (ref 78.0–100.0)
Platelets: 295 10*3/uL (ref 150.0–400.0)
RBC: 4.07 Mil/uL — ABNORMAL LOW (ref 4.22–5.81)
RDW: 13 % (ref 11.5–15.5)
WBC: 9.4 10*3/uL (ref 4.0–10.5)

## 2021-05-03 LAB — COMPREHENSIVE METABOLIC PANEL
ALT: 148 U/L — ABNORMAL HIGH (ref 0–53)
AST: 159 U/L — ABNORMAL HIGH (ref 0–37)
Albumin: 4.4 g/dL (ref 3.5–5.2)
Alkaline Phosphatase: 64 U/L (ref 39–117)
BUN: 8 mg/dL (ref 6–23)
CO2: 26 mEq/L (ref 19–32)
Calcium: 9.2 mg/dL (ref 8.4–10.5)
Chloride: 105 mEq/L (ref 96–112)
Creatinine, Ser: 0.99 mg/dL (ref 0.40–1.50)
GFR: 95.25 mL/min (ref >60.00)
Glucose, Bld: 91 mg/dL (ref 70–99)
Potassium: 4.4 mEq/L (ref 3.5–5.1)
Sodium: 143 mEq/L (ref 135–145)
Total Bilirubin: 0.6 mg/dL (ref 0.2–1.2)
Total Protein: 6.8 g/dL (ref 6.0–8.3)

## 2021-05-03 LAB — TSH: TSH: 1.53 u[IU]/mL (ref 0.35–5.50)

## 2021-05-03 LAB — LIPASE: Lipase: 67 U/L — ABNORMAL HIGH (ref 11.0–59.0)

## 2021-05-03 MED ORDER — HYDROXYZINE HCL 25 MG PO TABS: 12.5000 mg | ORAL_TABLET | Freq: Three times a day (TID) | ORAL | 0 refills | Status: DC | PRN

## 2021-05-03 MED ORDER — SERTRALINE HCL 25 MG PO TABS: 25.0000 mg | ORAL_TABLET | Freq: Every day | ORAL | 3 refills | Status: DC

## 2021-05-03 NOTE — Progress Notes (Signed)
Subjective:  Patient ID: Erik Velasquez, male    DOB: [birth year]  Age: 40 y.o. MRN: [id number]  CC:  Chief Complaint  Patient presents with   Alcohol Problem    Pt is here to seek help with alcohol addiction reports ongoing for 2 years. Prefers liquor, would like to know safe way to stop. Pt reports this is first attempt quitting was not previously a problem     HPI Erik Velasquez presents for   Alcohol addiction/abuse: Nightly for past year and half. Home more with Covid. Coping mechanism.  Vodka 7-8 drinks per night - straight.  40 yo at home, married. Tired of drinking.wants to stop.  No DUI.  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 No work concerns, no legal concerns.  Stressed at night. Trouble sleeping. Unknown sleep med in past.  Wakes up in the morning - acid reflux feeling - cough and some gag with some yellow bile. No daytime emesis. No hematemesis. No jaundice, no abd pain.  Denies hx of depression, but some anxiety.  Some decreased appetite. Some weight loss past 3-4 months. Up to 265, some diet changes. More weight loss past few months with upset stomach.  Wt Readings from Last 3 Encounters:  05/03/21 238 lb 12.8 oz (108.3 kg)  03/31/21 248 lb 9.6 oz (112.8 kg)  04/27/18 253 lb (114.8 kg)     Recently Rx Wellbutrin 150mg  BID -  9/7 for tobacco cessation. Has not yet started - needs to be ready.   On protonix 40mg  qd. No missed doses.   Depression screen [facility] 2/9 05/03/2021 03/31/2021 04/24/2019 11/17/2017 11/15/2016  Decreased Interest 1 0 0 0 0  Down, Depressed, Hopeless 1 0 0 0 0  PHQ - 2 Score 2 0 0 0 0  Altered sleeping 2 0 - 0 -  Tired, decreased energy 1 0 - 0 -  Change in appetite 1 0 - 0 -  Feeling bad or failure about yourself  1 0 - 0 -  Trouble concentrating 1 0 - 0 -  Moving slowly or fidgety/restless 1 0 - 0 -  Suicidal thoughts 0 0 - 0 -  PHQ-9 Score 9 0 - 0 -  Difficult doing work/chores - Not difficult at all - Not difficult at all -    11/19/2017 Visit from 05/03/2021 in [facility]  AUDIT-C Score 11        History Patient Active Problem List   Diagnosis Date Noted   Visit for preventive health examination 11/17/2017   Hypertension 11/15/2016   S/P cholecystectomy 11/15/2016   Past Medical History:  Diagnosis Date   History of chickenpox    Hypertension    Past Surgical History:  Procedure Laterality Date   CHOLECYSTECTOMY     ORTHOPEDIC SURGERY  2012   broken leg   No Known Allergies Prior to Admission medications   Medication Sig Start Date End Date Taking? Authorizing Provider  buPROPion (WELLBUTRIN SR) 150 MG 12 hr tablet Take 1 tablet (150 mg total) by mouth 2 (two) times daily. 03/31/21  Yes 2013, NP  lisinopril (ZESTRIL) 10 MG tablet Take 1 tablet (10 mg total) by mouth daily. 03/05/21  Yes Janeece Agee B, FNP  lisinopril (ZESTRIL) 20 MG tablet Take 1 tablet (20 mg total) by mouth daily. 03/31/21  Yes Worthy Rancher, NP  pantoprazole (PROTONIX) 40 MG tablet TAKE 1 TABLET(40 MG) BY MOUTH DAILY 03/31/21  Yes Janeece Agee, NP   Social History  Socioeconomic History   Marital status: Married    Spouse name: Not on file   Number of children: 1   Years of education: Not on file   Highest education level: Not on file  Occupational History   Not on file  Tobacco Use   Smoking status: Every Day    Packs/day: 0.50    Years: 12.00    Pack years: 6.00    Types: Cigarettes   Smokeless tobacco: Never  Vaping Use   Vaping Use: Never used  Substance and Sexual Activity   Alcohol use: Yes    Comment: 2 drinks (beer) per week   Drug use: No   Sexual activity: Yes  Other Topics Concern   Not on file  Social History Narrative   Not on file   Social Determinants of Health   Financial Resource Strain: Not on file  Food Insecurity: Not on file  Transportation Needs: Not on file  Physical Activity: Not on file  Stress: Not on file   Social Connections: Not on file  Intimate Partner Violence: Not on file    Review of Systems Per HPI  Objective:   Vitals:   05/03/21 0842  BP: 128/78  Pulse: 97  Resp: 16  Temp: 98 F (36.7 C)  TempSrc: Temporal  SpO2: 96%  Weight: 238 lb 12.8 oz (108.3 kg)  Height: 5\' 11"  (1.803 m)     Physical Exam Vitals reviewed.  Constitutional:      Appearance: He is well-developed.  HENT:     Head: Normocephalic and atraumatic.  Neck:     Vascular: No carotid bruit or JVD.  Cardiovascular:     Rate and Rhythm: Normal rate and regular rhythm.     Heart sounds: Normal heart sounds. No murmur heard. Pulmonary:     Effort: Pulmonary effort is normal.     Breath sounds: Normal breath sounds. No rales.  Abdominal:     General: There is no distension.     Palpations: There is no mass.     Tenderness: There is no abdominal tenderness. There is no right CVA tenderness, left CVA tenderness, guarding or rebound.  Musculoskeletal:     Right lower leg: No edema.     Left lower leg: No edema.  Skin:    General: Skin is warm and dry.  Neurological:     Mental Status: He is alert and oriented to person, place, and time.  Psychiatric:        Mood and Affect: Mood normal.        Behavior: Behavior normal.        Thought Content: Thought content normal.    45 minutes spent during visit, including chart review, counseling and assimilation of information, exam, discussion of plan, and chart completion.     Assessment & Plan:  Erik Velasquez is a 40 y.o. male . Anxiety - Plan: hydrOXYzine (ATARAX/VISTARIL) 25 MG tablet, sertraline (ZOLOFT) 25 MG tablet  Alcohol abuse  Insomnia, unspecified type - Plan: hydrOXYzine (ATARAX/VISTARIL) 25 MG tablet  Gastroesophageal reflux disease, unspecified whether esophagitis present - Plan: Comprehensive metabolic panel, Lipase, Ambulatory referral to Gastroenterology  Bilious vomiting, unspecified whether nausea present - Plan:  Comprehensive metabolic panel, Lipase, Ambulatory referral to Gastroenterology, CBC  Loss of weight - Plan: Comprehensive metabolic panel, Lipase, Ambulatory referral to Gastroenterology, CBC, TSH  Stomach upset - Plan: Comprehensive metabolic panel, Lipase, Ambulatory referral to Gastroenterology  Alcohol overuse as above.  Suspect some component of underlying anxiety/depression, with  insomnia.  Start low-dose sertraline, 25 mg daily, hydroxyzine if needed for sleep.  Resources provided for alcohol cessation abrupt cessation discouraged due to risk of withdrawal.  Option of inpatient treatment if needed.  With weight loss, vomiting, start with CMP, lipase, CBC, but refer to gastroenterology as may need further evaluation, possible endoscopy.  RTC/ER precautions.   Meds ordered this encounter  Medications   hydrOXYzine (ATARAX/VISTARIL) 25 MG tablet    Sig: Take 0.5-1 tablets (12.5-25 mg total) by mouth 3 (three) times daily as needed for anxiety (or sleep.).    Dispense:  30 tablet    Refill:  0   sertraline (ZOLOFT) 25 MG tablet    Sig: Take 1 tablet (25 mg total) by mouth daily.    Dispense:  30 tablet    Refill:  3   Patient Instructions  Start zoloft for anxiety, hydroxyzine if needed for sleep, anxiety.  I will check some labs today, continue protonix, but I will also refer you to gastroenterology to discuss yellow emesis and stomach issues.  Here are some resources for alcohol cessation. Cut back alcohol by no more than half for now until you are seen by one of these specialists to lessen risk of withdrawal.  Thanks for coming in today and let me know if there are questions.    [facility]  Address: [address] [place], [place] [address]  Phone: [phone]  [facility]  Address: [address], [place], [place] [address]  Phone: [phone]  [facility] Address: [address] [place], [place], [place] [address]  Phone: [phone]  Finding  Treatment for Addiction Addiction is a complex disease of the brain that causes an uncontrollable (compulsive) need for: A substance. This includes alcohol, illegal drugs, or prescription medicines, such as painkillers. An activity or behavior, such as gambling or shopping. Addiction changes the way your brain works. Because of this change: The need for the medicine, drug, or activity can become so strong that you think about it all the time. Getting more and more of your addiction becomes the most important thing to you. You may find yourself leaving other activities and relationships to pursue your addiction. You can become physically dependent on a substance. Your health, behavior, emotions, and relationships can change for the worse. How do I know if I need treatment for addiction? Addiction is a progressive disease. Without treatment, addiction can get worse. Living with addiction puts you at higher risk for injury, poor health, loss of employment, loss of money, and even death. You might need treatment for addiction if: You have tried to stop or cut down, but you have not succeeded. You find it annoying that your friends and family are concerned about your use or behavior. You feel guilty about your use or behavior. You need a particular substance or activity to start your day or to calm down. You are running out of money because of your addiction. You have done something illegal to support your addiction. Your addiction has caused you: Health problems. Trouble in school, work, home, or with the police. To devote all your time to your addiction, and not to other responsibilities. To tell lies in order to hide your problem. What types of treatment are available? There may be options for treatment programs and plans based on your addiction, condition, needs, and preferences. No single treatment is right for everyone. Treatment programs can be: Outpatient. You live at home and go to  work or  school, but you go to a clinic for treatment. Inpatient. You live and sleep at the program facility during treatment. Programs may include: Medicine. You may need medicine to treat the addiction itself, or to treat anxiety or depression. Counseling and behavior therapy. This can help individuals and families behave in healthier ways and relate more effectively. Support groups. Confidential group therapy, such as a 12-step program, can help individuals and families during treatment and recovery. A combination of education, counseling, and a 12-step, spirituality-based approach. What should I consider when selecting a treatment program? Think about your individual requirements when selecting a treatment program. Ask about: The overall approach to treatment. Some programs are strictly 12-step programs. Some have a more flexible approach. Programs may differ in length of stay, setting, and size. Some programs include your family in your treatment plan. Support may be offered to them throughout the treatment process, as well as instructions for them when you are discharged. You may continue to receive support after you have left the program. The types of medical services that are offered. Find out if the program: Offers specific treatment for your particular addiction. Meets all of your needs, including physical and cultural needs. Includes any medicines you might need. Offers mental health counseling as part of your treatment. Offers the 12-step meetings at the center, or if transport is available for patients to attend meetings at other locations. The cost and types of insurance that are accepted. Some programs are sponsored by the government. They support patients who do not have private insurance. If you do not have insurance, or if you choose to attend a program that does not accept your insurance, call the treatment center. Tell them your financial needs and whether a payment plan can  be set up. There are also organizations that will help you find the resources for treatment. You can find them online by searching "treatment for addiction." If the program is certified by the appropriate government agency. Where to find support Your health care provider can help you to find the right treatment. These discussions are confidential. The [employer] on Alcoholism and Drug Dependence (NCADD). This group has information about treatment centers and programs for people who have an addiction and for family members. Call: 1-800-NCA-CALL (8432053553). Visit the website: https://www.ncadd.org/ The Substance Abuse and Mental Health Services Administration [facility]). This organization will help you find publicly funded treatment centers, help hotlines, and counseling services near you. Call: 1-800-662-HELP (3143806219). Visit the website: www.findtreatment.RockToxic.pl The National Problem Gambling Helpline. This is a 24-hour confidential helpline for gambling addiction. Call: 580-296-3662 Visit the website: [employer] In countries outside of the [place] and [place], look in [employer] for contact information for services in your area. Follow these instructions at home: Find supportive people who will help you stay away from your addiction and stay sober. Do not use the substance or engage in the activity. If you have been through treatment: Follow your plan. The plan is usually developed by you and your health care provider during treatment. Go to meetings with other people in recovery. Avoid people, situations, and things that lead you to do the things you are addicted to (triggers). Summary Addiction changes the way your brain works. These changes cause a desire to repeat and increase the use of the substance or behavior. Addiction is a progressive disease. Without treatment, addiction can get worse. Living with addiction puts you at higher risk for  injury, poor health, loss of employment, loss of money, and even death.  There may be options for treatment programs and plans based on your addiction, condition, needs, and preferences. No single treatment is right for everyone. Your health care provider can help you to find the right treatment. These discussions are confidential. This information is not intended to replace advice given to you by your health care provider. Make sure you discuss any questions you have with your health care provider. Document Revised: 07/09/2020 Document Reviewed: 08/09/2017 Elsevier Patient Education  2022 Elsevier Inc.   Insomnia Insomnia is a sleep disorder that makes it difficult to fall asleep or stay asleep. Insomnia can cause fatigue, low energy, difficulty concentrating, mood swings, and poor performance at work or school. There are three different ways to classify insomnia: Difficulty falling asleep. Difficulty staying asleep. Waking up too early in the morning. Any type of insomnia can be long-term (chronic) or short-term (acute). Both are common. Short-term insomnia usually lasts for three months or less. Chronic insomnia occurs at least three times a week for longer than three months. What are the causes? Insomnia may be caused by another condition, situation, or substance, such as: Anxiety. Certain medicines. Gastroesophageal reflux disease (GERD) or other gastrointestinal conditions. Asthma or other breathing conditions. Restless legs syndrome, sleep apnea, or other sleep disorders. Chronic pain. Menopause. Stroke. Abuse of alcohol, tobacco, or illegal drugs. Mental health conditions, such as depression. Caffeine. Neurological disorders, such as Alzheimer's disease. An overactive thyroid (hyperthyroidism). Sometimes, the cause of insomnia may not be known. What increases the risk? Risk factors for insomnia include: Gender. Women are affected more often than men. Age. Insomnia is more  common as you get older. Stress. Lack of exercise. Irregular work schedule or working night shifts. Traveling between different time zones. Certain medical and mental health conditions. What are the signs or symptoms? If you have insomnia, the main symptom is having trouble falling asleep or having trouble staying asleep. This may lead to other symptoms, such as: Feeling fatigued or having low energy. Feeling nervous about going to sleep. Not feeling rested in the morning. Having trouble concentrating. Feeling irritable, anxious, or depressed. How is this diagnosed? This condition may be diagnosed based on: Your symptoms and medical history. Your health care provider may ask about: Your sleep habits. Any medical conditions you have. Your mental health. A physical exam. How is this treated? Treatment for insomnia depends on the cause. Treatment may focus on treating an underlying condition that is causing insomnia. Treatment may also include: Medicines to help you sleep. Counseling or therapy. Lifestyle adjustments to help you sleep better. Follow these instructions at home: Eating and drinking  Limit or avoid alcohol, caffeinated beverages, and cigarettes, especially close to bedtime. These can disrupt your sleep. Do not eat a large meal or eat spicy foods right before bedtime. This can lead to digestive discomfort that can make it hard for you to sleep. Sleep habits  Keep a sleep diary to help you and your health care provider figure out what could be causing your insomnia. Write down: When you sleep. When you wake up during the night. How well you sleep. How rested you feel the next day. Any side effects of medicines you are taking. What you eat and drink. Make your bedroom a dark, comfortable place where it is easy to fall asleep. Put up shades or blackout curtains to block light from outside. Use a white noise machine to block noise. Keep the temperature cool. Limit  screen use before bedtime. This includes: Watching TV. Using your  smartphone, tablet, or computer. Stick to a routine that includes going to bed and waking up at the same times every day and night. This can help you fall asleep faster. Consider making a quiet activity, such as reading, part of your nighttime routine. Try to avoid taking naps during the day so that you sleep better at night. Get out of bed if you are still awake after 15 minutes of trying to sleep. Keep the lights down, but try reading or doing a quiet activity. When you feel sleepy, go back to bed. General instructions Take over-the-counter and prescription medicines only as told by your health care provider. Exercise regularly, as told by your health care provider. Avoid exercise starting several hours before bedtime. Use relaxation techniques to manage stress. Ask your health care provider to suggest some techniques that may work well for you. These may include: Breathing exercises. Routines to release muscle tension. Visualizing peaceful scenes. Make sure that you drive carefully. Avoid driving if you feel very sleepy. Keep all follow-up visits as told by your health care provider. This is important. Contact a health care provider if: You are tired throughout the day. You have trouble in your daily routine due to sleepiness. You continue to have sleep problems, or your sleep problems get worse. Get help right away if: You have serious thoughts about hurting yourself or someone else. If you ever feel like you may hurt yourself or others, or have thoughts about taking your own life, get help right away. You can go to your nearest emergency department or call: Your local emergency services (911 in the [place] A suicide crisis helpline, such as the National Suicide Prevention Lifeline at 385-815-8848. This is open 24 hours a day. Summary Insomnia is a sleep disorder that makes it difficult to fall asleep or stay  asleep. Insomnia can be long-term (chronic) or short-term (acute). Treatment for insomnia depends on the cause. Treatment may focus on treating an underlying condition that is causing insomnia. Keep a sleep diary to help you and your health care provider figure out what could be causing your insomnia. This information is not intended to replace advice given to you by your health care provider. Make sure you discuss any questions you have with your health care provider. Document Revised: 05/21/2020 Document Reviewed: 05/21/2020 Elsevier Patient Education  2022 Elsevier Inc.     Signed,   Meredith Staggers, MD [facility], [facility] 05/03/21 8:49 AM

## 2021-05-03 NOTE — Patient Instructions (Addendum)
Start zoloft for anxiety, hydroxyzine if needed for sleep, anxiety.  I will check some labs today, continue protonix, but I will also refer you to gastroenterology to discuss yellow emesis and stomach issues.  Here are some resources for alcohol cessation. Cut back alcohol by no more than half for now until you are seen by one of these specialists to lessen risk of withdrawal.  Thanks for coming in today and let me know if there are questions.    [facility]  Address: [address] [place], [place] [address]  Phone: [phone]  [facility]  Address: [address] [place], [place] [address]  Phone: [phone]  [facility] Address: [address] [place], [place], [place] [address]  Phone: [phone]  Finding Treatment for Addiction Addiction is a complex disease of the brain that causes an uncontrollable (compulsive) need for: A substance. This includes alcohol, illegal drugs, or prescription medicines, such as painkillers. An activity or behavior, such as gambling or shopping. Addiction changes the way your brain works. Because of this change: The need for the medicine, drug, or activity can become so strong that you think about it all the time. Getting more and more of your addiction becomes the most important thing to you. You may find yourself leaving other activities and relationships to pursue your addiction. You can become physically dependent on a substance. Your health, behavior, emotions, and relationships can change for the worse. How do I know if I need treatment for addiction? Addiction is a progressive disease. Without treatment, addiction can get worse. Living with addiction puts you at higher risk for injury, poor health, loss of employment, loss of money, and even death. You might need treatment for addiction if: You have tried to stop or cut down, but you have not succeeded. You find it annoying that your friends and family are concerned  about your use or behavior. You feel guilty about your use or behavior. You need a particular substance or activity to start your day or to calm down. You are running out of money because of your addiction. You have done something illegal to support your addiction. Your addiction has caused you: Health problems. Trouble in school, work, home, or with the police. To devote all your time to your addiction, and not to other responsibilities. To tell lies in order to hide your problem. What types of treatment are available? There may be options for treatment programs and plans based on your addiction, condition, needs, and preferences. No single treatment is right for everyone. Treatment programs can be: Outpatient. You live at home and go to work or school, but you go to a clinic for treatment. Inpatient. You live and sleep at the program facility during treatment. Programs may include: Medicine. You may need medicine to treat the addiction itself, or to treat anxiety or depression. Counseling and behavior therapy. This can help individuals and families behave in healthier ways and relate more effectively. Support groups. Confidential group therapy, such as a 12-step program, can help individuals and families during treatment and recovery. A combination of education, counseling, and a 12-step, spirituality-based approach. What should I consider when selecting a treatment program? Think about your individual requirements when selecting a treatment program. Ask about: The overall approach to treatment. Some programs are strictly 12-step programs. Some have a more flexible approach. Programs may differ in length of stay, setting, and size. Some programs include your family in your treatment plan. Support may be offered  to them throughout the treatment process, as well as instructions for them when you are discharged. You may continue to receive support after you have left the program. The types  of medical services that are offered. Find out if the program: Offers specific treatment for your particular addiction. Meets all of your needs, including physical and cultural needs. Includes any medicines you might need. Offers mental health counseling as part of your treatment. Offers the 12-step meetings at the center, or if transport is available for patients to attend meetings at other locations. The cost and types of insurance that are accepted. Some programs are sponsored by the government. They support patients who do not have private insurance. If you do not have insurance, or if you choose to attend a program that does not accept your insurance, call the treatment center. Tell them your financial needs and whether a payment plan can be set up. There are also organizations that will help you find the resources for treatment. You can find them online by searching "treatment for addiction." If the program is certified by the appropriate government agency. Where to find support Your health care provider can help you to find the right treatment. These discussions are confidential. The [employer] on Alcoholism and Drug Dependence (NCADD). This group has information about treatment centers and programs for people who have an addiction and for family members. Call: 1-800-NCA-CALL (785-332-1340). Visit the website: https://www.ncadd.org/ The Substance Abuse and Mental Health Services Administration [facility]). This organization will help you find publicly funded treatment centers, help hotlines, and counseling services near you. Call: 1-800-662-HELP (5856082976). Visit the website: www.findtreatment.RockToxic.pl The National Problem Gambling Helpline. This is a 24-hour confidential helpline for gambling addiction. Call: (309)027-6252 Visit the website: [employer] In countries outside of the [place] and [place], look in [employer] for contact information for  services in your area. Follow these instructions at home: Find supportive people who will help you stay away from your addiction and stay sober. Do not use the substance or engage in the activity. If you have been through treatment: Follow your plan. The plan is usually developed by you and your health care provider during treatment. Go to meetings with other people in recovery. Avoid people, situations, and things that lead you to do the things you are addicted to (triggers). Summary Addiction changes the way your brain works. These changes cause a desire to repeat and increase the use of the substance or behavior. Addiction is a progressive disease. Without treatment, addiction can get worse. Living with addiction puts you at higher risk for injury, poor health, loss of employment, loss of money, and even death. There may be options for treatment programs and plans based on your addiction, condition, needs, and preferences. No single treatment is right for everyone. Your health care provider can help you to find the right treatment. These discussions are confidential. This information is not intended to replace advice given to you by your health care provider. Make sure you discuss any questions you have with your health care provider. Document Revised: 07/09/2020 Document Reviewed: 08/09/2017 Elsevier Patient Education  2022 Elsevier Inc.   Insomnia Insomnia is a sleep disorder that makes it difficult to fall asleep or stay asleep. Insomnia can cause fatigue, low energy, difficulty concentrating, mood swings, and poor performance at work or school. There are three different ways to classify insomnia: Difficulty falling asleep. Difficulty staying asleep. Waking up too early in the morning. Any type of insomnia can be long-term (chronic) or  short-term (acute). Both are common. Short-term insomnia usually lasts for three months or less. Chronic insomnia occurs at least three times a week for  longer than three months. What are the causes? Insomnia may be caused by another condition, situation, or substance, such as: Anxiety. Certain medicines. Gastroesophageal reflux disease (GERD) or other gastrointestinal conditions. Asthma or other breathing conditions. Restless legs syndrome, sleep apnea, or other sleep disorders. Chronic pain. Menopause. Stroke. Abuse of alcohol, tobacco, or illegal drugs. Mental health conditions, such as depression. Caffeine. Neurological disorders, such as Alzheimer's disease. An overactive thyroid (hyperthyroidism). Sometimes, the cause of insomnia may not be known. What increases the risk? Risk factors for insomnia include: Gender. Women are affected more often than men. Age. Insomnia is more common as you get older. Stress. Lack of exercise. Irregular work schedule or working night shifts. Traveling between different time zones. Certain medical and mental health conditions. What are the signs or symptoms? If you have insomnia, the main symptom is having trouble falling asleep or having trouble staying asleep. This may lead to other symptoms, such as: Feeling fatigued or having low energy. Feeling nervous about going to sleep. Not feeling rested in the morning. Having trouble concentrating. Feeling irritable, anxious, or depressed. How is this diagnosed? This condition may be diagnosed based on: Your symptoms and medical history. Your health care provider may ask about: Your sleep habits. Any medical conditions you have. Your mental health. A physical exam. How is this treated? Treatment for insomnia depends on the cause. Treatment may focus on treating an underlying condition that is causing insomnia. Treatment may also include: Medicines to help you sleep. Counseling or therapy. Lifestyle adjustments to help you sleep better. Follow these instructions at home: Eating and drinking  Limit or avoid alcohol, caffeinated beverages,  and cigarettes, especially close to bedtime. These can disrupt your sleep. Do not eat a large meal or eat spicy foods right before bedtime. This can lead to digestive discomfort that can make it hard for you to sleep. Sleep habits  Keep a sleep diary to help you and your health care provider figure out what could be causing your insomnia. Write down: When you sleep. When you wake up during the night. How well you sleep. How rested you feel the next day. Any side effects of medicines you are taking. What you eat and drink. Make your bedroom a dark, comfortable place where it is easy to fall asleep. Put up shades or blackout curtains to block light from outside. Use a white noise machine to block noise. Keep the temperature cool. Limit screen use before bedtime. This includes: Watching TV. Using your smartphone, tablet, or computer. Stick to a routine that includes going to bed and waking up at the same times every day and night. This can help you fall asleep faster. Consider making a quiet activity, such as reading, part of your nighttime routine. Try to avoid taking naps during the day so that you sleep better at night. Get out of bed if you are still awake after 15 minutes of trying to sleep. Keep the lights down, but try reading or doing a quiet activity. When you feel sleepy, go back to bed. General instructions Take over-the-counter and prescription medicines only as told by your health care provider. Exercise regularly, as told by your health care provider. Avoid exercise starting several hours before bedtime. Use relaxation techniques to manage stress. Ask your health care provider to suggest some techniques that may work well for you.  These may include: Breathing exercises. Routines to release muscle tension. Visualizing peaceful scenes. Make sure that you drive carefully. Avoid driving if you feel very sleepy. Keep all follow-up visits as told by your health care provider. This  is important. Contact a health care provider if: You are tired throughout the day. You have trouble in your daily routine due to sleepiness. You continue to have sleep problems, or your sleep problems get worse. Get help right away if: You have serious thoughts about hurting yourself or someone else. If you ever feel like you may hurt yourself or others, or have thoughts about taking your own life, get help right away. You can go to your nearest emergency department or call: Your local emergency services (911 in the [place] A suicide crisis helpline, such as the National Suicide Prevention Lifeline at (503) 840-0660. This is open 24 hours a day. Summary Insomnia is a sleep disorder that makes it difficult to fall asleep or stay asleep. Insomnia can be long-term (chronic) or short-term (acute). Treatment for insomnia depends on the cause. Treatment may focus on treating an underlying condition that is causing insomnia. Keep a sleep diary to help you and your health care provider figure out what could be causing your insomnia. This information is not intended to replace advice given to you by your health care provider. Make sure you discuss any questions you have with your health care provider. Document Revised: 05/21/2020 Document Reviewed: 05/21/2020 Elsevier Patient Education  2022 [employer].

## 2021-05-04 ENCOUNTER — Telehealth: Payer: Self-pay

## 2021-05-04 ENCOUNTER — Encounter: Payer: Self-pay | Admitting: Family Medicine

## 2021-05-04 ENCOUNTER — Encounter: Payer: Self-pay | Admitting: Physician Assistant

## 2021-05-04 NOTE — Telephone Encounter (Signed)
Caller name:Chris Pasch   On DPR? :no  Call back number:[phone]  Provider they see: Kateri Plummer   Reason for call:Wants to give the name of provider for [facility] [phone] Jyothi Christean Leaf Doctor. Richard was going to see about putting him in for a referral and this is the doctor they would like to use.

## 2021-05-04 NOTE — Telephone Encounter (Signed)
See previous message on provider preference for referral for patient.

## 2021-05-04 NOTE — Telephone Encounter (Signed)
Awaiting office notes to be closed once that happens I will fax over referral to Dr. Kenna Gilbert office.

## 2021-05-05 ENCOUNTER — Other Ambulatory Visit: Payer: Self-pay | Admitting: Family

## 2021-05-05 ENCOUNTER — Encounter: Payer: Self-pay | Admitting: Gastroenterology

## 2021-05-05 ENCOUNTER — Ambulatory Visit (INDEPENDENT_AMBULATORY_CARE_PROVIDER_SITE_OTHER): Payer: BC Managed Care – PPO | Admitting: Gastroenterology

## 2021-05-05 VITALS — BP 160/100 | HR 82 | Ht 71.0 in | Wt 237.8 lb

## 2021-05-05 DIAGNOSIS — I10 Essential (primary) hypertension: Secondary | ICD-10-CM

## 2021-05-05 DIAGNOSIS — F1020 Alcohol dependence, uncomplicated: Secondary | ICD-10-CM | POA: Diagnosis not present

## 2021-05-05 DIAGNOSIS — K219 Gastro-esophageal reflux disease without esophagitis: Secondary | ICD-10-CM | POA: Diagnosis not present

## 2021-05-05 DIAGNOSIS — R748 Abnormal levels of other serum enzymes: Secondary | ICD-10-CM

## 2021-05-05 DIAGNOSIS — R112 Nausea with vomiting, unspecified: Secondary | ICD-10-CM

## 2021-05-05 NOTE — Patient Instructions (Signed)
If you are age 40 or older, your body mass index should be between 23-30. Your Body mass index is 33.17 kg/m. If this is out of the aforementioned range listed, please consider follow up with your Primary Care Provider.  If you are age 82 or younger, your body mass index should be between 19-25. Your Body mass index is 33.17 kg/m. If this is out of the aformentioned range listed, please consider follow up with your Primary Care Provider.   You will be contacted by [facility] in the next 2 days to arrange a RUQ ultrasound.  The number on your caller ID will be [phone], please answer when they call.  If you have not heard from them in 2 days please call [phone] to schedule.    The [facility] providers would like to encourage you to use [facility] to communicate with providers for non-urgent requests or questions.  Due to long hold times on the telephone, sending your provider a message by [facility] may be a faster and more efficient way to get a response.  Please allow 48 business hours for a response.  Please remember that this is for non-urgent requests.   It was a pleasure to see you today!  Thank you for trusting me with your gastrointestinal care!    Scott E. Tomasa Rand, MD

## 2021-05-07 ENCOUNTER — Encounter: Payer: Self-pay | Admitting: Gastroenterology

## 2021-05-07 NOTE — Progress Notes (Signed)
HPI : Erik Velasquez is a very pleasant 40 year old male who is referred to [place] by Maximiano Coss, NP for further evaluation of chronic nausea and vomiting.  He also has been admitted problem with alcohol abuse recently, and has decided to stop drinking.  He presented to the emergency room October 10 with plans to be admitted for detoxification, but he eloped before the assessment was complete.  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  A mild macrocytic anemia was also noted. The patient states that he has been having symptoms of nausea and vomiting most mornings for the past several months.  Every morning he wakes with a bitter taste in his mouth and significant nausea and frequently has multiple episodes of emesis or dry heaves.  Sometimes the emesis is yellow or green-colored.  He denies seeing any blood in his emesis.  Typically does not have problems with nausea or vomiting later in the day.  He does not have postprandial nausea or vomiting.  He denies heartburn.  He does not wake up from sleep with acid regurgitation.  He has been taking Nexium or Protonix daily and states that these medications do not seem to help with the nausea and vomiting episodes.  He denies any abdominal pain.  He has lost weight unintentionally over the past year.  In the past 3 months he has lost about 12 pounds.  He denies any problems with constipation or diarrhea.  No blood in the stool. The patient reports significant increase in his alcohol consumption over the past year.  He reports using this as a coping mechanism for stress and anxiety.  He has been drinking up to 12 shots of liquor a day, all in the evening time.  He states he realizes that this is not healthy and not sustainable, and he is ready to stop drinking completely.  Past  Medical History:  Diagnosis Date   History of chickenpox    Hypertension      Past Surgical History:  Procedure Laterality Date   CHOLECYSTECTOMY     ORTHOPEDIC SURGERY  2012   broken leg   Family History  Problem Relation Age of Onset   Diabetes Mother    Healthy Mother    Healthy Father    Healthy Sister    Healthy Maternal Grandmother    Alzheimer's disease Maternal Grandfather    Healthy Son    Social History   Tobacco Use   Smoking status: Every Day    Packs/day: 0.50    Years: 12.00    Pack years: 6.00    Types: Cigarettes   Smokeless tobacco: Never  Vaping Use   Vaping Use: Never used  Substance Use Topics   Alcohol use: Yes    Comment: 2 drinks (beer) per week   Drug use: No   Current Outpatient Medications  Medication Sig Dispense Refill   hydrOXYzine (ATARAX/VISTARIL) 25 MG tablet Take 0.5-1 tablets (12.5-25 mg total) by mouth 3 (three) times daily as needed for anxiety (or sleep.). 30 tablet 0   lisinopril (ZESTRIL) 20 MG tablet Take 1 tablet (20 mg total) by mouth daily. 90 tablet 0   pantoprazole (PROTONIX) 40 MG tablet TAKE 1 TABLET(40 MG) BY MOUTH DAILY 90 tablet 1   sertraline (ZOLOFT) 25 MG tablet Take 1 tablet (25 mg total) by mouth daily. [place]  tablet 3   buPROPion (WELLBUTRIN SR) 150 MG 12 hr tablet Take 1 tablet (150 mg total) by mouth 2 (two) times daily. (Patient not taking: Reported on 05/05/2021) 180 tablet 0   No current facility-administered medications for this visit.   No Known Allergies   Review of Systems: All systems reviewed and negative except where noted in HPI.    No results found.  Physical Exam: BP (!) 160/100   Pulse 82   Ht $R'5\' 11"'GA$  (1.803 m)   Wt 237 lb 12.8 oz (107.9 kg)   BMI 33.17 kg/m  Constitutional: Pleasant,well-developed, Caucasian male in no acute distress.  Accompanied by wife HEENT: Normocephalic and atraumatic. Conjunctivae are normal. No scleral icterus. Cardiovascular: Normal rate, regular rhythm.   Pulmonary/chest: Effort normal and breath sounds normal. No wheezing, rales or rhonchi. Abdominal: Soft, nondistended, nontender. Bowel sounds active throughout. There are no masses palpable. No hepatomegaly. Extremities: no edema Neurological: Alert and oriented to person place and time. Skin: Skin is warm and dry. No rashes noted. Psychiatric: Normal mood and affect. Behavior is normal.  CBC    Component Value Date/Time   WBC 9.4 05/03/2021 0938   RBC 4.07 (L) 05/03/2021 0938   HGB 13.9 05/03/2021 0938   HCT 41.7 05/03/2021 0938   PLT 295.0 05/03/2021 0938   MCV 102.4 (H) 05/03/2021 0938   MCHC 33.3 05/03/2021 0938   RDW 13.0 05/03/2021 0938   LYMPHSABS 2.5 11/17/2017 0820   MONOABS 0.5 11/17/2017 0820   EOSABS 0.1 11/17/2017 0820   BASOSABS 0.0 11/17/2017 0820    CMP     Component Value Date/Time   NA 143 05/03/2021 0938   K 4.4 05/03/2021 0938   CL 105 05/03/2021 0938   CO2 26 05/03/2021 0938   GLUCOSE 91 05/03/2021 0938   BUN 8 05/03/2021 0938   CREATININE 0.99 05/03/2021 0938   CALCIUM 9.2 05/03/2021 0938   PROT 6.8 05/03/2021 0938   ALBUMIN 4.4 05/03/2021 0938   AST 159 (H) 05/03/2021 0938   ALT 148 (H) 05/03/2021 0938   ALKPHOS 64 05/03/2021 0938   BILITOT 0.6 05/03/2021 0938   GFRNONAA >60 09/25/2008 2123   GFRAA  09/25/2008 2123    >60        The eGFR has been calculated using the MDRD equation. This calculation has not been validated in all clinical situations. eGFR's persistently <60 mL/min signify possible Chronic Kidney Disease.     ASSESSMENT AND PLAN: 40 year old male with significant alcohol use with objective evidence of organ damage (elevated liver enzymes, macrocytic anemia) related to alcohol use, also with daily episodes of nausea and vomiting.  It is unclear if the nausea and vomiting episodes are related to nocturnal reflux versus alcohol related.  The patient seems determined and committed to stop drinking completely.  He is planning  to wean off his consumption over the next week as opposed to stopping cold [place] to reduce his risk of withdrawal.  I think this is reasonable plan.  Although I suspect his symptoms of nausea and vomiting will likely improve significantly with alcohol cessation, whether it be reflux related or directly related to alcohol, I would like to schedule him for an upper endoscopy.  If his symptoms resolve completely with abstinence, he can cancel his procedure.  In the meantime, it is reasonable for him to maximize his PPI therapy to see if this improves his symptoms. The patient was given resources for alcohol cessation by his primary provider earlier this week, but  the patient feels that he can stop drinking independently with the support of his wife.  Given his heavy alcohol use, would like to image his liver to assess for evidence of steatosis or even cirrhosis.  This could also be used as a baseline for comparison for if he is able to stop drinking.  Nausea/vomiting - EGD - Increase Protonix to 40 mg twice a day  Alcohol use disorder - Wean alcohol intake over the next week, then stop completely - Obtain right upper quadrant ultrasound to assess for hepatic steatosis or cirrhosis  The details, risks (including bleeding, perforation, infection, missed lesions, medication reactions and possible hospitalization or surgery if complications occur), benefits, and alternatives to EGD with possible biopsy were discussed with the patient and he consents to proceed.   Lurline Caver E. Candis Schatz, MD [facility]  CC:  Maximiano Coss, NP

## 2021-05-13 ENCOUNTER — Ambulatory Visit (HOSPITAL_COMMUNITY)
Admission: RE | Admit: 2021-05-13 | Discharge: 2021-05-13 | Disposition: A | Discharge status: Home / Self Care | Payer: BC Managed Care – PPO | Source: Ambulatory Visit | Attending: Gastroenterology | Admitting: Gastroenterology

## 2021-05-13 DIAGNOSIS — K219 Gastro-esophageal reflux disease without esophagitis: Secondary | ICD-10-CM | POA: Diagnosis not present

## 2021-05-13 DIAGNOSIS — R748 Abnormal levels of other serum enzymes: Secondary | ICD-10-CM | POA: Diagnosis not present

## 2021-05-13 DIAGNOSIS — R945 Abnormal results of liver function studies: Secondary | ICD-10-CM | POA: Diagnosis not present

## 2021-05-17 ENCOUNTER — Encounter: Payer: Self-pay | Admitting: Family Medicine

## 2021-05-17 ENCOUNTER — Ambulatory Visit: Payer: BC Managed Care – PPO | Admitting: Family Medicine

## 2021-05-17 ENCOUNTER — Telehealth: Payer: Self-pay | Admitting: Gastroenterology

## 2021-05-17 ENCOUNTER — Other Ambulatory Visit: Payer: Self-pay

## 2021-05-17 VITALS — BP 128/84 | HR 72 | Temp 98.2°F | Resp 17 | Ht 71.0 in | Wt 237.0 lb

## 2021-05-17 DIAGNOSIS — F1010 Alcohol abuse, uncomplicated: Secondary | ICD-10-CM | POA: Diagnosis not present

## 2021-05-17 DIAGNOSIS — R748 Abnormal levels of other serum enzymes: Secondary | ICD-10-CM

## 2021-05-17 DIAGNOSIS — G4700 Insomnia, unspecified: Secondary | ICD-10-CM

## 2021-05-17 DIAGNOSIS — R7989 Other specified abnormal findings of blood chemistry: Secondary | ICD-10-CM | POA: Diagnosis not present

## 2021-05-17 DIAGNOSIS — F419 Anxiety disorder, unspecified: Secondary | ICD-10-CM

## 2021-05-17 NOTE — Progress Notes (Signed)
Erik Velasquez, your ultrasound showed evidence of steatosis which is a change seen in chronic alcohol abuse.  No features of cirrhosis were seen, which is good.  Steatosis is typically reversible, meaning that with alcohol cessation, your liver could return to normal.  Continued alcohol use will put you at increased risk of cirrhosis, which significantly impacts life expectancy.  Please refrain from alcohol use and consider using formal alcohol cessation programs to support you through stopping drinking.   We will see you for your upcoming EGD.  Please contact us if any problems/questions arise before then.

## 2021-05-17 NOTE — Patient Instructions (Addendum)
Congrats on stopping alcohol. Glad to hear you are doing so well! No change in meds for now. If tests are still elevated, can follow up with gastroenterology or can repeat them in next few weeks.  Return to the clinic or go to the nearest emergency room if any of your symptoms worsen or new symptoms occur.

## 2021-05-17 NOTE — Telephone Encounter (Signed)
Patient called wanting to know results of ultrasound.  Please call and advise.  Thank you.

## 2021-05-17 NOTE — Telephone Encounter (Signed)
Please see note below and advise.,

## 2021-05-17 NOTE — Progress Notes (Signed)
Subjective:  Patient ID: Erik Velasquez, male    DOB: [birth year]  Age: 40 y.o. MRN: [id number]  CC:  Chief Complaint  Patient presents with   Anxiety    Pt reports doing well started low dose sertraline no concerns doing well. No side effects     HPI Erik Velasquez presents for     Anxiety, alcohol abuse/addiction. Follow-up from October 10 visit.  Some increased stress, difficulty sleeping, overuse of alcohol with plan for Cessation.  He had recently been started on Wellbutrin 150 mg twice daily for tobacco cessation, but had not taken. Was taking Protonix 40 mg daily.  Had noticed some weight loss previous 3 to 4 months but had made some diet changes.  Did have some weight loss with upset stomach.  Started on Zoloft for anxiety, 25 mg daily with option of hydroxyzine if needed for anxiety or sleep.  Referred to gastroenterology, resources provided for alcohol cessation. Presented to ER October 10 for detox but eloped before assessment was complete.  Did have elevated LFTs in hepatocellular pattern with AST predominant, normal bili.  Mild microcytic anemia, hypomagnesemia.  Seen by gastroenterology October 12 with nausea and vomiting most mornings. Plan for EGD ( in December) increase Protonix to 40 mg twice daily, plan for slow wean of alcohol intake over the next week and then stopping completely.  Ultrasound was obtained with diffuse echogenic liver consistent with hepatic steatosis or hepatocellular disease, status post cholecystectomy. Of note lipase was elevated at 67 on his visit with me on October 10.  Elevated LFTs at that time as well at AST 159, ALT 148.   Tapered off alcohol over 4 days. Last drink on 10/13.  No withdrawal symptoms.  Vomiting has resolved last week.  No abdominal pain, no fever.  Feels really well.  More energy since last visit. No FH of alcoholism.  No cravings.   Mood is better. Tolerating zoloft. Only took few days of hydroxyzine. Going to  sleep ok.  Depression screen [facility] 2/9 05/17/2021 05/03/2021 03/31/2021 04/24/2019 11/17/2017  Decreased Interest 0 1 0 0 0  Down, Depressed, Hopeless 0 1 0 0 0  PHQ - 2 Score 0 2 0 0 0  Altered sleeping 0 2 0 - 0  Tired, decreased energy 0 1 0 - 0  Change in appetite 0 1 0 - 0  Feeling bad or failure about yourself  0 1 0 - 0  Trouble concentrating 0 1 0 - 0  Moving slowly or fidgety/restless 0 1 0 - 0  Suicidal thoughts 0 0 0 - 0  PHQ-9 Score 0 9 0 - 0  Difficult doing work/chores - - Not difficult at all - Not difficult at all   GAD 7 : Generalized Anxiety Score 05/17/2021  Nervous, Anxious, on Edge 0  Control/stop worrying 0  Worry too much - different things 0  Trouble relaxing 1  Restless 0  Easily annoyed or irritable 0  Afraid - awful might happen 0  Total GAD 7 Score 1     History Patient Active Problem List   Diagnosis Date Noted   Visit for preventive health examination 11/17/2017   Hypertension 11/15/2016   S/P cholecystectomy 11/15/2016   Past Medical History:  Diagnosis Date   History of chickenpox    Hypertension    Past Surgical History:  Procedure Laterality Date   CHOLECYSTECTOMY     ORTHOPEDIC SURGERY  2012   broken leg   No Known Allergies  Prior to Admission medications   Medication Sig Start Date End Date Taking? Authorizing Provider  hydrOXYzine (ATARAX/VISTARIL) 25 MG tablet Take 0.5-1 tablets (12.5-25 mg total) by mouth 3 (three) times daily as needed for anxiety (or sleep.). 05/03/21  Yes Shade Flood, MD  lisinopril (ZESTRIL) 20 MG tablet Take 1 tablet (20 mg total) by mouth daily. 03/31/21  Yes Janeece Agee, NP  pantoprazole (PROTONIX) 40 MG tablet TAKE 1 TABLET(40 MG) BY MOUTH DAILY 03/31/21  Yes Janeece Agee, NP  sertraline (ZOLOFT) 25 MG tablet Take 1 tablet (25 mg total) by mouth daily. 05/03/21  Yes Shade Flood, MD  buPROPion [facility] SR) 150 MG 12 hr tablet Take 1 tablet (150 mg total) by mouth 2 (two) times  daily. Patient not taking: No sig reported 03/31/21   Janeece Agee, NP   Social History   Socioeconomic History   Marital status: Married    Spouse name: Not on file   Number of children: 1   Years of education: Not on file   Highest education level: Not on file  Occupational History   Not on file  Tobacco Use   Smoking status: Every Day    Packs/day: 0.50    Years: 12.00    Pack years: 6.00    Types: Cigarettes   Smokeless tobacco: Never  Vaping Use   Vaping Use: Never used  Substance and Sexual Activity   Alcohol use: Yes    Comment: 2 drinks (beer) per week   Drug use: No   Sexual activity: Yes  Other Topics Concern   Not on file  Social History Narrative   Not on file   Social Determinants of Health   Financial Resource Strain: Not on file  Food Insecurity: Not on file  Transportation Needs: Not on file  Physical Activity: Not on file  Stress: Not on file  Social Connections: Not on file  Intimate Partner Violence: Not on file    Review of Systems Per HPI.   Objective:   Vitals:   05/17/21 1252  BP: 128/84  Pulse: 72  Resp: 17  Temp: 98.2 F (36.8 C)  TempSrc: Temporal  SpO2: 98%  Weight: 237 lb (107.5 kg)  Height: 5\' 11"  (1.803 m)     Physical Exam Vitals reviewed.  Constitutional:      General: He is not in acute distress.    Appearance: Normal appearance. He is well-developed. He is not toxic-appearing or diaphoretic.  HENT:     Head: Normocephalic and atraumatic.  Neck:     Vascular: No carotid bruit or JVD.  Cardiovascular:     Rate and Rhythm: Normal rate and regular rhythm.     Heart sounds: Normal heart sounds. No murmur heard. Pulmonary:     Effort: Pulmonary effort is normal.     Breath sounds: Normal breath sounds. No rales.  Musculoskeletal:     Right lower leg: No edema.     Left lower leg: No edema.  Skin:    General: Skin is warm and dry.  Neurological:     Mental Status: He is alert and oriented to person, place,  and time.  Psychiatric:        Attention and Perception: Attention normal.        Mood and Affect: Mood normal.        Speech: Speech normal.        Behavior: Behavior normal.        Thought Content: Thought content normal.  Assessment & Plan:  Erik Velasquez is a 40 y.o. male . Anxiety Alcohol abuse Insomnia, unspecified type  - doing well and off alcohol. Commended on cessation.  -mood doing well on zoloft, has hydroxyzine if needed.   Elevated LFTs - Plan: Comprehensive metabolic panel Elevated lipase - Plan: Lipase  - abdominal pain, n/v has resolved. Possible prior pancreatitis, now resolved. Repeat CMP, lipase. Continue follow up with GI, plan for EGD. Continue PPI. RTC precautions.   Hypomagnesemia - Plan: Magnesium  - repeat labs, low at ER evaluation.   No orders of the defined types were placed in this encounter.  Patient Instructions  Congrats on stopping alcohol. Glad to hear you are doing so well! No change in meds for now. If tests are still elevated, can follow up with gastroenterology or can repeat them in next few weeks.  Return to the clinic or go to the nearest emergency room if any of your symptoms worsen or new symptoms occur.       Signed,   Meredith Staggers, MD [facility], [facility] 05/17/21 5:46 PM

## 2021-05-18 LAB — MAGNESIUM: Magnesium: 2 mg/dL (ref 1.5–2.5)

## 2021-05-18 LAB — COMPREHENSIVE METABOLIC PANEL
ALT: 41 U/L (ref 0–53)
AST: 22 U/L (ref 0–37)
Albumin: 4.3 g/dL (ref 3.5–5.2)
Alkaline Phosphatase: 63 U/L (ref 39–117)
BUN: 12 mg/dL (ref 6–23)
CO2: 27 mEq/L (ref 19–32)
Calcium: 10.1 mg/dL (ref 8.4–10.5)
Chloride: 105 mEq/L (ref 96–112)
Creatinine, Ser: 1.15 mg/dL (ref 0.40–1.50)
GFR: 79.55 mL/min (ref >60.00)
Glucose, Bld: 87 mg/dL (ref 70–99)
Potassium: 4.4 mEq/L (ref 3.5–5.1)
Sodium: 140 mEq/L (ref 135–145)
Total Bilirubin: 0.5 mg/dL (ref 0.2–1.2)
Total Protein: 6.6 g/dL (ref 6.0–8.3)

## 2021-05-18 LAB — LIPASE: Lipase: 64 U/L — ABNORMAL HIGH (ref 11.0–59.0)

## 2021-05-25 ENCOUNTER — Other Ambulatory Visit: Payer: Self-pay | Admitting: Family Medicine

## 2021-05-25 DIAGNOSIS — R748 Abnormal levels of other serum enzymes: Secondary | ICD-10-CM

## 2021-05-25 NOTE — Progress Notes (Signed)
See labs 

## 2021-05-26 ENCOUNTER — Ambulatory Visit: Payer: BC Managed Care – PPO | Admitting: Physician Assistant

## 2021-06-28 ENCOUNTER — Encounter: Payer: Self-pay | Admitting: Gastroenterology

## 2021-07-01 ENCOUNTER — Encounter: Payer: BC Managed Care – PPO | Admitting: Gastroenterology

## 2021-07-04 ENCOUNTER — Encounter: Payer: Self-pay | Admitting: Gastroenterology

## 2021-07-05 ENCOUNTER — Encounter: Payer: BC Managed Care – PPO | Admitting: Gastroenterology

## 2021-07-06 ENCOUNTER — Encounter: Payer: BC Managed Care – PPO | Admitting: Registered Nurse

## 2021-08-17 ENCOUNTER — Ambulatory Visit: Payer: BC Managed Care – PPO | Admitting: Registered Nurse

## 2021-10-11 ENCOUNTER — Other Ambulatory Visit: Payer: Self-pay | Admitting: Registered Nurse

## 2021-10-11 DIAGNOSIS — I10 Essential (primary) hypertension: Secondary | ICD-10-CM

## 2021-12-02 ENCOUNTER — Encounter (HOSPITAL_COMMUNITY): Payer: Self-pay | Admitting: Behavioral Health

## 2021-12-02 ENCOUNTER — Other Ambulatory Visit (HOSPITAL_COMMUNITY)
Admission: EM | Admit: 2021-12-02 | Discharge: 2021-12-03 | Discharge status: Against Medical Advice | Payer: BC Managed Care – PPO | Attending: Psychiatry | Admitting: Psychiatry

## 2021-12-02 DIAGNOSIS — F1090 Alcohol use, unspecified, uncomplicated: Secondary | ICD-10-CM

## 2021-12-02 DIAGNOSIS — F1094 Alcohol use, unspecified with alcohol-induced mood disorder: Secondary | ICD-10-CM | POA: Insufficient documentation

## 2021-12-02 DIAGNOSIS — F1020 Alcohol dependence, uncomplicated: Secondary | ICD-10-CM | POA: Insufficient documentation

## 2021-12-02 DIAGNOSIS — Z20822 Contact with and (suspected) exposure to covid-19: Secondary | ICD-10-CM | POA: Insufficient documentation

## 2021-12-02 DIAGNOSIS — F10139 Alcohol abuse with withdrawal, unspecified: Secondary | ICD-10-CM | POA: Diagnosis not present

## 2021-12-02 DIAGNOSIS — F10930 Alcohol use, unspecified with withdrawal, uncomplicated: Secondary | ICD-10-CM | POA: Diagnosis not present

## 2021-12-02 DIAGNOSIS — I10 Essential (primary) hypertension: Secondary | ICD-10-CM | POA: Insufficient documentation

## 2021-12-02 DIAGNOSIS — F1994 Other psychoactive substance use, unspecified with psychoactive substance-induced mood disorder: Secondary | ICD-10-CM

## 2021-12-02 LAB — COMPREHENSIVE METABOLIC PANEL
ALT: 174 U/L — ABNORMAL HIGH (ref 0–44)
AST: 203 U/L — ABNORMAL HIGH (ref 15–41)
Albumin: 4.4 g/dL (ref 3.5–5.0)
Alkaline Phosphatase: 69 U/L (ref 38–126)
Anion gap: 17 — ABNORMAL HIGH (ref 5–15)
BUN: 6 mg/dL (ref 6–20)
CO2: 21 mmol/L — ABNORMAL LOW (ref 22–32)
Calcium: 9.2 mg/dL (ref 8.9–10.3)
Chloride: 103 mmol/L (ref 98–111)
Creatinine, Ser: 0.97 mg/dL (ref 0.61–1.24)
GFR, Estimated: >60 mL/min (ref >60)
Glucose, Bld: 106 mg/dL — ABNORMAL HIGH (ref 70–99)
Potassium: 3.6 mmol/L (ref 3.5–5.1)
Sodium: 141 mmol/L (ref 135–145)
Total Bilirubin: 0.5 mg/dL (ref 0.3–1.2)
Total Protein: 7.3 g/dL (ref 6.5–8.1)

## 2021-12-02 LAB — HEMOGLOBIN A1C
Hgb A1c MFr Bld: 5.5 % (ref 4.8–5.6)
Mean Plasma Glucose: 111.15 mg/dL

## 2021-12-02 LAB — POC SARS CORONAVIRUS 2 AG: SARSCOV2ONAVIRUS 2 AG: NEGATIVE

## 2021-12-02 LAB — POCT URINE DRUG SCREEN - MANUAL ENTRY (I-SCREEN)
POC Amphetamine UR: NOT DETECTED
POC Buprenorphine (BUP): NOT DETECTED
POC Cocaine UR: NOT DETECTED
POC Marijuana UR: POSITIVE — AB
POC Methadone UR: NOT DETECTED
POC Methamphetamine UR: NOT DETECTED
POC Morphine: NOT DETECTED
POC Oxazepam (BZO): NOT DETECTED
POC Oxycodone UR: NOT DETECTED
POC Secobarbital (BAR): NOT DETECTED

## 2021-12-02 LAB — TSH: TSH: 1.294 u[IU]/mL (ref 0.350–4.500)

## 2021-12-02 LAB — CBC WITH DIFFERENTIAL/PLATELET
Abs Immature Granulocytes: 0.04 10*3/uL (ref 0.00–0.07)
Basophils Absolute: 0 10*3/uL (ref 0.0–0.1)
Basophils Relative: 0 %
Eosinophils Absolute: 0 10*3/uL (ref 0.0–0.5)
Eosinophils Relative: 0 %
HCT: 44.1 % (ref 39.0–52.0)
Hemoglobin: 15.8 g/dL (ref 13.0–17.0)
Immature Granulocytes: 0 %
Lymphocytes Relative: 26 %
Lymphs Abs: 2.6 10*3/uL (ref 0.7–4.0)
MCH: 34.5 pg — ABNORMAL HIGH (ref 26.0–34.0)
MCHC: 35.8 g/dL (ref 30.0–36.0)
MCV: 96.3 fL (ref 80.0–100.0)
Monocytes Absolute: 0.6 10*3/uL (ref 0.1–1.0)
Monocytes Relative: 6 %
Neutro Abs: 6.9 10*3/uL (ref 1.7–7.7)
Neutrophils Relative %: 68 %
Platelets: 293 10*3/uL (ref 150–400)
RBC: 4.58 MIL/uL (ref 4.22–5.81)
RDW: 12 % (ref 11.5–15.5)
WBC: 10.2 10*3/uL (ref 4.0–10.5)
nRBC: 0 % (ref 0.0–0.2)

## 2021-12-02 LAB — LIPID PANEL
Cholesterol: 213 mg/dL — ABNORMAL HIGH (ref 0–200)
HDL: 115 mg/dL (ref >40)
LDL Cholesterol: 88 mg/dL (ref 0–99)
Total CHOL/HDL Ratio: 1.9 RATIO
Triglycerides: 50 mg/dL (ref <150)
VLDL: 10 mg/dL (ref 0–40)

## 2021-12-02 LAB — ETHANOL: Alcohol, Ethyl (B): 319 mg/dL (ref <10)

## 2021-12-02 LAB — RESP PANEL BY RT-PCR (FLU A&B, COVID) ARPGX2
Influenza A by PCR: NEGATIVE
Influenza B by PCR: NEGATIVE
SARS Coronavirus 2 by RT PCR: NEGATIVE

## 2021-12-02 MED ORDER — LORAZEPAM 1 MG PO TABS
1.0000 mg | ORAL_TABLET | Freq: Four times a day (QID) | ORAL | Status: DC
  Administered 2021-12-02 – 2021-12-03 (×4): 1 mg via ORAL
  Filled 2021-12-02 (×4): qty 1

## 2021-12-02 MED ORDER — TRAZODONE HCL 50 MG PO TABS
50.0000 mg | ORAL_TABLET | Freq: Every evening | ORAL | Status: DC | PRN
  Administered 2021-12-02: 50 mg via ORAL
  Filled 2021-12-02: qty 1

## 2021-12-02 MED ORDER — LORAZEPAM 1 MG PO TABS: 1.0000 mg | ORAL_TABLET | Freq: Two times a day (BID) | ORAL | Status: DC

## 2021-12-02 MED ORDER — ADULT MULTIVITAMIN W/MINERALS CH
1.0000 | ORAL_TABLET | Freq: Every day | ORAL | Status: DC
  Administered 2021-12-02 – 2021-12-03 (×2): 1 via ORAL
  Filled 2021-12-02 (×2): qty 1

## 2021-12-02 MED ORDER — LORAZEPAM 1 MG PO TABS
1.0000 mg | ORAL_TABLET | Freq: Four times a day (QID) | ORAL | Status: DC | PRN
  Administered 2021-12-03: 1 mg via ORAL
  Filled 2021-12-02: qty 1

## 2021-12-02 MED ORDER — ONDANSETRON 4 MG PO TBDP
4.0000 mg | ORAL_TABLET | Freq: Four times a day (QID) | ORAL | Status: DC | PRN
  Administered 2021-12-02 – 2021-12-03 (×3): 4 mg via ORAL
  Filled 2021-12-02 (×3): qty 1

## 2021-12-02 MED ORDER — LISINOPRIL 20 MG PO TABS
20.0000 mg | ORAL_TABLET | Freq: Every day | ORAL | Status: DC
  Administered 2021-12-02 – 2021-12-03 (×2): 20 mg via ORAL
  Filled 2021-12-02 (×2): qty 1

## 2021-12-02 MED ORDER — THIAMINE HCL 100 MG PO TABS
100.0000 mg | ORAL_TABLET | Freq: Every day | ORAL | Status: DC
  Administered 2021-12-03: 100 mg via ORAL
  Filled 2021-12-02: qty 1

## 2021-12-02 MED ORDER — LOPERAMIDE HCL 2 MG PO CAPS: 2.0000 mg | ORAL_CAPSULE | ORAL | Status: DC | PRN

## 2021-12-02 MED ORDER — LORAZEPAM 1 MG PO TABS: 1.0000 mg | ORAL_TABLET | Freq: Every day | ORAL | Status: DC

## 2021-12-02 MED ORDER — HYDROXYZINE HCL 25 MG PO TABS
25.0000 mg | ORAL_TABLET | Freq: Three times a day (TID) | ORAL | Status: DC | PRN
  Administered 2021-12-02 – 2021-12-03 (×2): 25 mg via ORAL
  Filled 2021-12-02 (×2): qty 1

## 2021-12-02 MED ORDER — THIAMINE HCL 100 MG/ML IJ SOLN
100.0000 mg | Freq: Once | INTRAMUSCULAR | Status: AC
  Administered 2021-12-02: 100 mg via INTRAMUSCULAR
  Filled 2021-12-02: qty 2

## 2021-12-02 MED ORDER — LORAZEPAM 1 MG PO TABS: 1.0000 mg | ORAL_TABLET | Freq: Three times a day (TID) | ORAL | Status: DC

## 2021-12-02 MED ORDER — SERTRALINE HCL 25 MG PO TABS
25.0000 mg | ORAL_TABLET | Freq: Every day | ORAL | Status: DC
  Administered 2021-12-03: 25 mg via ORAL
  Filled 2021-12-02: qty 1

## 2021-12-02 MED ORDER — NICOTINE 21 MG/24HR TD PT24
21.0000 mg | MEDICATED_PATCH | Freq: Every day | TRANSDERMAL | Status: DC
  Administered 2021-12-02 – 2021-12-03 (×2): 21 mg via TRANSDERMAL
  Filled 2021-12-02 (×2): qty 1

## 2021-12-02 MED ORDER — PANTOPRAZOLE SODIUM 40 MG PO TBEC
40.0000 mg | DELAYED_RELEASE_TABLET | Freq: Every day | ORAL | Status: DC
  Administered 2021-12-03: 40 mg via ORAL
  Filled 2021-12-02: qty 1

## 2021-12-02 NOTE — ED Notes (Signed)
Pt asleep in bed. Respirations even and unlabored. Will continue to monitor for safety. ?

## 2021-12-02 NOTE — Progress Notes (Signed)
Patient presenting for alcohol detox.  States that he has been drinking a fifth daily for the past year.  He states that he is ready to "be done with this."  Patient drank this morning and has no current withdrawal smptoms other than an elevated blood pressure.  He admits that he  is depressed and sometimes wishes he was dead.  Nos pecific suicide plan and no previous attempts or psychiatric hospitalizations.  He denies HI/Psychosis.  States that he has not been sleeping or eating with recent weight loss of 20 pounds.  No history of abuse or suicidal ideation.  Patient states, "I am embarrased to be here."  Patient is urgent. ?

## 2021-12-02 NOTE — BH Assessment (Signed)
? ? ? ? ? ? ? ?Comprehensive Clinical Assessment (CCA) Note ? ?12/02/2021 ?Erik Velasquez ?[id number] ? ?Disposition: F10.20 Alcohol Use Disorder Severe ?                    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 ? ?The patient demonstrates the following risk factors for suicide: Chronic risk factors for suicide include: psychiatric disorder of depression and substance use disorder. Acute risk factors for suicide include: family or marital conflict. Protective factors for this patient include: positive social support and hope for the future. Considering these factors, the overall suicide risk at this point appears to be moderate. Patient is not appropriate for outpatient follow up.  ? ?AUDIT   ? ?Flowsheet Row Office Visit from 05/03/2021 in [facility]  ?Alcohol Use Disorder Identification Test Final Score (AUDIT) 19  ? ?  ? ?GAD-7   ? ?Flowsheet Row Office Visit from 05/17/2021 in [facility]  ?Total GAD-7 Score 1  ? ?  ? ?PHQ2-9   ? ?Flowsheet Row ED from 12/02/2021 in [facility] Office Visit from 05/17/2021 in [facility] Office Visit from 05/03/2021 in [facility] Office Visit from 03/31/2021 in [facility] Office Visit from 04/24/2019 in [facility]  ?PHQ-2 Total Score 5 0 2 0 0  ?PHQ-9 Total Score 20 0 9 0 --  ? ?  ? ?Flowsheet Row ED from 12/02/2021 in [facility]  ?C-SSRS RISK CATEGORY Low Risk  ? ?  ?  ?Chief Complaint:  ?Chief Complaint  ?Patient presents with  ? Addiction Problem  ? ?Visit Diagnosis: F10.20 Alcohol Use Disorder Severe  ?                            F10.94 Alcohol Induced Mood Disorder ? ?CCA Screening, Triage and Referral (STR) ? ?Patient Reported Information ?How did you hear about [place]?  Family/Friend ? ?What Is the Reason for Your Visit/Call Today? Patient presenting for alcohol detox.  States that he has been drinking a fifth daily for the past year.  He states that he is ready to "be done with this."  Patient drank this morning and has no current withdrawal smptoms other than an elevated blood pressure.  He admits that he  is depressed and sometimes wishes he was dead.  Nos pecific suicide plan and no previous attempts or psychiatric hospitalizations.  He denies HI/Psychosis.  States that he has not been sleeping or eating with recent weight loss of 20 pounds.  No history of abuse or suicidal ideation.  Patient states, "I am embarrased to be here."  Patient is urgent. ? ?Patient wife, Ahmet Schank, present at the [facility] with patient states that patient has not been taking his blood pressure medication.  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  She states that he has not been able to get any food down.  She states that he tapered down and stopped drinking for two months, but relapsed because he never received any treatment.  She states that patient was out of work for [employer] a year ago and states that he was very stressed during that time and his drinking increased.  She states that patient's father has a history of depression  and has experienced suicidal ideation in the past. ? ? ?How Long Has This Been Causing You Problems? > than 6 months ? ?What Do You Feel Would Help You the Most Today? Alcohol or Drug Use Treatment ? ? ?Have You Recently Had Any Thoughts About Hurting Yourself? Yes ? ?Are You Planning to Commit Suicide/Harm Yourself At This time? No ? ? ?Have you Recently Had Thoughts About Hurting Someone Karolee Ohslse? No ? ?Are You Planning to Harm Someone at This Time? No ? ?Explanation: No data recorded ? ?Have You Used Any Alcohol or Drugs in the Past 24 Hours? Yes ? ?How Long Ago Did You Use Drugs or Alcohol? No data  recorded ?What Did You Use and How Much? 10 oz liquor ? ? ?Do You Currently Have a Therapist/Psychiatrist? No data recorded ?Name of Therapist/Psychiatrist: No data recorded ? ?Have You Been Recently Discharged From Any Office Practice or Programs? No data recorded ?Explanation of Discharge From Practice/Program: No data recorded ? ?  ?CCA Screening Triage Referral Assessment ?Type of Contact: No data recorded ?Telemedicine Service Delivery:   ?Is this Initial or Reassessment? No data recorded ?Date Telepsych consult ordered in [facility]:  No data recorded ?Time Telepsych consult ordered in [facility]:  No data recorded ?Location of Assessment: No data recorded ?Provider Location: No data recorded ? ?Collateral Involvement: No data recorded ? ?Does Patient Have a Automotive engineerCourt Appointed Legal Guardian? No data recorded ?Name and Contact of Legal Guardian: No data recorded ?If Minor and Not Living with Parent(s), Who has Custody? No data recorded ?Is CPS involved or ever been involved? No data recorded ?Is APS involved or ever been involved? No data recorded ? ?Patient Determined To Be At Risk for Harm To Self or Others Based on Review of Patient Reported Information or Presenting Complaint? No data recorded ?Method: No data recorded ?Availability of Means: No data recorded ?Intent: No data recorded ?Notification Required: No data recorded ?Additional Information for Danger to Others Potential: No data recorded ?Additional Comments for Danger to Others Potential: No data recorded ?Are There Guns or Other Weapons in Your Home? No data recorded ?Types of Guns/Weapons: No data recorded ?Are These Weapons Safely Secured?                            No data recorded ?Who Could Verify You Are Able To Have These Secured: No data recorded ?Do You Have any Outstanding Charges, Pending Court Dates, Parole/Probation? No data recorded ?Contacted To Inform of Risk of Harm To Self or Others: No data recorded ? ? ?Does Patient Present under Involuntary  Commitment? No data recorded ?IVC Papers Initial File Date: No data recorded ? ?[place] ? ?Patient Currently Receiving the Following Services: No data recorded ? ?Determination of Need: Urgent (48 hours) ? ? ?Options For Referral: Inpatient Hospitalization ? ? ? ? ?CCA Biopsychosocial ?Patient Reported Schizophrenia/Schizoaffective Diagnosis in Past: No ? ? ?Strengths: Patient's family is supportive ? ? ?Mental Health Symptoms ?Depression:   ?Increase/decrease in appetite; Sleep (too much or little); Weight gain/loss; Hopelessness; Worthlessness; Change in energy/activity ?  ?Duration of Depressive symptoms:  ?Duration of Depressive Symptoms: Greater than two weeks ?  ?Mania:   ?None ?  ?Anxiety:    ?Sleep; Restlessness; Tension; Worrying ?  ?Psychosis:   ?None ?  ?Duration of Psychotic symptoms:    ?Trauma:   ?None ?  ?Obsessions:   ?None ?  ?Compulsions:   ?None ?  ?  Inattention:   ?None ?  ?Hyperactivity/Impulsivity:   ?None ?  ?Oppositional/Defiant Behaviors:   ?None ?  ?Emotional Irregularity:   ?None ?  ?Other Mood/Personality Symptoms:   ?dspressed mood, cooperative ?  ? ?Mental Status Exam ?Appearance and self-care  ?Stature:   ?Average ?  ?Weight:   ?Average weight ?  ?Clothing:   ?Neat/clean; Meticulous ?  ?Grooming:   ?Normal ?  ?Cosmetic use:   ?None ?  ?Posture/gait:   ?Normal ?  ?Motor activity:   ?Not Remarkable ?  ?Sensorium  ?Attention:   ?Normal ?  ?Concentration:   ?Variable ?  ?Orientation:   ?Object; Person; Place; Situation; Time ?  ?Recall/memory:   ?Normal ?  ?Affect and Mood  ?Affect:   ?Depressed; Flat ?  ?Mood:   ?Depressed ?  ?Relating  ?Eye contact:   ?Normal ?  ?Facial expression:   ?Depressed ?  ?Attitude toward examiner:   ?Cooperative ?  ?Thought and Language  ?Speech flow:  ?Clear and Coherent ?  ?Thought content:   ?Appropriate to Mood and Circumstances ?  ?Preoccupation:   ?None ?  ?Hallucinations:   ?None ?  ?Organization:  No data recorded   ?Executive Functions  ?Fund of Knowledge:   ?Good ?  ?Intelligence:   ?Above Average ?  ?Abstraction:   ?Normal ?  ?Judgement:   ?Impaired ?  ?Reality Testing:   ?Realistic ?  ?Insight:   ?Fair ?  ?Decision Making:   ?Normal ?

## 2021-12-02 NOTE — ED Notes (Signed)
Pt was given a sandwich and juice.  

## 2021-12-02 NOTE — ED Notes (Addendum)
Pt admitted to [facility] for alcohol detox. Pt A&O x4, calm and cooperative. Pt reports he had SI earlier today with no plan. Pt denies current SI/HI/AVH. Pt tolerated admission process and skin assessment well. Pt ambulated independently to unit. Oriented to unit/staff. Pt declined offer for food; water given. No signs of acute distress noted. Pt briefly laid down in bed then went to AA meeting. Will continue to monitor for safety.  ?

## 2021-12-02 NOTE — Progress Notes (Signed)
Patient is alert and oriented X 4, denies SI, HI and AVH admitted to observation unit until the covid test PCR two hour result, then patient will be admitted to the [facility] unit for substance treatment. ?

## 2021-12-02 NOTE — ED Provider Notes (Signed)
Facility Based Crisis Admission H&P ? ?Date: 12/02/21 ?Patient Name: Erik Velasquez ?MRN: [id number] ?Chief Complaint:  ?Chief Complaint  ?Patient presents with  ? Addiction Problem  ?   ? ?Diagnoses:  ?Final diagnoses:  ?Substance induced mood disorder (HCC)  ?Alcohol use disorder  ? ? ?HPI: Erik Velasquez is a 41 year old male who presents to the [facility] behavioral health urgent care voluntary as a walk-in accompanied by his wife and parents with a chief complaint of requesting alcohol detox. ? ?Patient seen and evaluated face-to-face by this provider, chart reviewed and case discussed with Dr. Bronwen Betters. On evaluation, patient is alert and oriented x4. His thought process is logical and goal oriented. His speech is clear and coherent. His mood is depressed and affect is congruent.  ? ?Patient states that he is an "alcoholic." He states that he has not been drinking that much alcohol in the past couple days and feels terrible. He describes his alcohol withdrawal symptoms as nausea, decreased appetite, last vomited on Tuesday and body aches. He reports drinking liquor, on average and half of fifth daily for the past 2 years. He states that he has never had detox from alcohol in the past. He denies a history of alcohol withdrawal seizures and DTs. He denies using illicit drugs.  ? ?He reports feeling depressed for the past 6 months and describes his depressive symptoms as sadness, worthlessness, hopelessness, decreased energy level, poor appetite, lost 15 pounds in 3 weeks, crying spells, irritability, anhedonia, guilt, and difficulty sleeping. He identifies life stressors as "drinking alcohol." He states that he has a great wife and 60 year old son and he wants to do better for them. He denies auditory or visual hallucinations. There is no objective evidence that the patient is currently responding to internal or external stimuli. ? ?He denies suicidal ideations but states that he "does not want to  be in the place that he is in."  He denies homicidal ideations. He denies a past history of attempting suicide. He denies past history of psychiatric inpatient treatment. He states that he does not have any outpatient psychiatric services. He states that he tried to get help about 6 months ago and was started on Zoloft and Vistaril for anxiety. He states that he has a history of HTN and is prescribed lisinopril 20 mg. He states that he did not take his blood pressure medicine this morning. Patient blood pressure on arrival is 180/117. Patient is asymptomatic. Will restart lisinopril 20 mg p.o. today. Patient states that he also takes Protonix daily.  Patient states that he resides with his wife and 16 year old son. Patient denies a family history of mental illness.  ? ?Plan of care-I discussed with the patient admission to the facility based crisis unit to start an Ativan detox protocol (d/t hx of elevated liver enzymes) for alcohol withdrawal symptoms. I discussed with the patient that psychotherapy groups are offered throughout the day for additional support. Patient verbalizes understanding and agrees to the stated plan.  ? ? ?PHQ 2-9:  ?Flowsheet Row ED from 12/02/2021 in [facility] Office Visit from 05/17/2021 in [facility] Office Visit from 05/03/2021 in [facility]  ?Thoughts that you would be better off dead, or of hurting yourself in some way Several days Not at all Not at all  ?PHQ-9 Total Score 20 0 9  ? ?  ?  ?Flowsheet Row ED from 12/02/2021 in [facility]  ?  C-SSRS RISK CATEGORY Low Risk  ? ?  ?  ? ?Total Time spent with patient: 45 minutes ? ?Musculoskeletal  ?Strength & Muscle Tone: within normal limits ?Gait & Station: normal ?Patient leans: N/A ? ?Psychiatric Specialty Exam  ?Presentation ?General Appearance: Appropriate for Environment ? ?Eye  Contact:Fair ? ?Speech:Clear and Coherent ? ?Speech Volume:Normal ? ?Handedness:No data recorded ? ?Mood and Affect  ?Mood:Depressed ? ?Affect:Congruent ? ? ?Thought Process  ?Thought Processes:Goal Directed; Coherent ? ?Descriptions of Associations:Intact ? ?Orientation:Full (Time, Place and Person) ? ?Thought Content:Logical ?  ?Diagnosis of Schizophrenia or Schizoaffective disorder in past: No ?  ?Hallucinations:Hallucinations: None ? ?Ideas of Reference:None ? ?Suicidal Thoughts:Suicidal Thoughts: No ? ?Homicidal Thoughts:Homicidal Thoughts: No ? ? ?Sensorium  ?Memory:Immediate Good; Remote Good; Recent Good ? ?Judgment:Fair ? ?Insight:Present ? ? ?Executive Functions  ?Concentration:Good ? ?Attention Span:Fair ? ?Recall:Good ? ?Fund of Knowledge:Good ? ?Language:Good ? ? ?Psychomotor Activity  ?Psychomotor Activity:Psychomotor Activity: Normal ? ? ?Assets  ?Assets:Communication Skills; Desire for Improvement; Financial Resources/Insurance; Intimacy; Housing; Leisure Time; Physical Health; Resilience; Social Support; Transportation ? ? ?Sleep  ?Sleep:Sleep: Poor ? ? ?Nutritional Assessment (For OBS and FBC admissions only) ?Has the patient had a weight loss or gain of 10 pounds or more in the last 3 months?: Yes ?Has the patient had a decrease in food intake/or appetite?: Yes ?Does the patient have dental problems?: No ?Does the patient have eating habits or behaviors that may be indicators of an eating disorder including binging or inducing vomiting?: No ?Has the patient recently lost weight without trying?: 2 ?Has the patient been eating poorly because of a decreased appetite?: 1 ?Malnutrition Screening Tool Score: 3 ?Nutritional Assessment Referrals: Medication/Tx changes ? ? ? ?Physical Exam ?HENT:  ?   Head: Normocephalic.  ?   Nose: Nose normal.  ?Eyes:  ?   Conjunctiva/sclera: Conjunctivae normal.  ?Cardiovascular:  ?   Rate and Rhythm: Normal rate.  ?   Comments: Hypertensive  ?Pulmonary:  ?   Effort:  Pulmonary effort is normal.  ?Musculoskeletal:     ?   General: Normal range of motion.  ?   Cervical back: Normal range of motion.  ?Neurological:  ?   Mental Status: He is alert and oriented to person, place, and time.  ? ?Review of Systems  ?Constitutional: Negative.   ?HENT: Negative.    ?Eyes: Negative.   ?Respiratory: Negative.    ?Cardiovascular: Negative.   ?Gastrointestinal:  Positive for nausea.  ?Genitourinary: Negative.   ?Musculoskeletal:  Positive for myalgias.  ?Skin: Negative.   ?Neurological: Negative.   ?Endo/Heme/Allergies: Negative.   ? ?Blood pressure (!) 180/117, pulse 93, temperature 98.1 ?F (36.7 ?C), temperature source Oral, resp. rate 18, SpO2 96 %. There is no height or weight on file to calculate BMI. ? ?Past Psychiatric History: hx of alcohol use and anxiety. ? ?Is the patient at risk to self? No  ?Has the patient been a risk to self in the past 6 months? No .    ?Has the patient been a risk to self within the distant past? No   ?Is the patient a risk to others? No   ?Has the patient been a risk to others in the past 6 months? No   ?Has the patient been a risk to others within the distant past? No  ? ?Past Medical History:  ?Past Medical History:  ?Diagnosis Date  ? History of chickenpox   ? Hypertension   ?  ?Past Surgical History:  ?Procedure Laterality Date  ?  CHOLECYSTECTOMY    ? ORTHOPEDIC SURGERY  2012  ? broken leg  ? ? ?Family History:  ?Family History  ?Problem Relation Age of Onset  ? Diabetes Mother   ? Healthy Mother   ? Healthy Father   ? Healthy Sister   ? Healthy Maternal Grandmother   ? Alzheimer's disease Maternal Grandfather   ? Healthy Son   ? ? ?Social History:  ?Social History  ? ?Socioeconomic History  ? Marital status: Married  ?  Spouse name: Not on file  ? Number of children: 1  ? Years of education: Not on file  ? Highest education level: Not on file  ?Occupational History  ? Not on file  ?Tobacco Use  ? Smoking status: Every Day  ?  Packs/day: 0.50  ?  Years:  12.00  ?  Pack years: 6.00  ?  Types: Cigarettes  ? Smokeless tobacco: Never  ?Vaping Use  ? Vaping Use: Never used  ?Substance and Sexual Activity  ? Alcohol use: Yes  ?  Comment: 2 drinks (beer) per week  ? Drug use: No

## 2021-12-03 DIAGNOSIS — F10930 Alcohol use, unspecified with withdrawal, uncomplicated: Secondary | ICD-10-CM | POA: Diagnosis not present

## 2021-12-03 MED ORDER — ONDANSETRON 4 MG PO TBDP
4.0000 mg | ORAL_TABLET | Freq: Four times a day (QID) | ORAL | 0 refills | Status: DC | PRN
Start: 2021-12-03 — End: 2023-04-19

## 2021-12-03 MED ORDER — MIRTAZAPINE 15 MG PO TABS: 15.0000 mg | ORAL_TABLET | ORAL | Status: DC

## 2021-12-03 MED ORDER — ADULT MULTIVITAMIN W/MINERALS CH: 1.0000 | ORAL_TABLET | Freq: Every day | ORAL | 0 refills | Status: DC

## 2021-12-03 MED ORDER — THIAMINE HCL 100 MG PO TABS: 100.0000 mg | ORAL_TABLET | Freq: Every day | ORAL | 0 refills | Status: DC

## 2021-12-03 MED ORDER — LORAZEPAM 1 MG PO TABS: 1.0000 mg | ORAL_TABLET | ORAL | 0 refills | Status: DC

## 2021-12-03 NOTE — ED Notes (Signed)
Pt reports he would like to discharge on Saturday.  Reports  " I feel like my symptoms are getting better and my BP has come down."  Staff encouraged pt to follow through until his medication taper has ended to assist with any withdrawal symptoms he may experience. Informed provider of pt's statement.  No new orders at this time.  Will continue to monitor for safety. ?

## 2021-12-03 NOTE — ED Notes (Signed)
Pt refused lunch but later came and got small size salad ?

## 2021-12-03 NOTE — ED Notes (Signed)
Pt requested to leave facility.  Provider notified.  AMA paperwork signed and reviewed.  No belongings were collected at time of admission to facility. ?

## 2021-12-03 NOTE — Clinical Social Work Psych Note (Addendum)
LCSW Initial/ Discharge Note  ? ?LCSW met with Erik Velasquez for introduction and to begin discussions regarding treatment and potential discharge planning.  ? ?Erik Velasquez presented with an euthymic affect congruent mood. He was pleasant and cooperative during this encounter. Erik Velasquez denied having any SI, HI or AVH at this time, however he did reports seeing people in his room, while his eyes are closed since being on the [facility]. He reports this is the first time he experienced this and is unsure of the cause.  ? ?Erik Velasquez shared that he came to the [facility] seeking assistance for alcohol withdrawal symptoms. He endorsed drinking a fifth of liquor daily for the past two years.  ? ?Erik Velasquez shared that his alcohol use increased after he would occasionally have a few drinks after work, however over time his usage increased leading to physical dependency. Erik Velasquez shared that without drinking alcohol, he is not able to drink, sleep or function "properly".  ? ?Erik Velasquez reports that he has not been able to eat for the last two weeks. Erik Velasquez also shared that he has struggled with acid. reflux, which he believes is a result to the removal of gallbladder. He also contributes his alcohol use to his decrease in appetite. Dr. Serafina Mitchell shared with patient that decrease in appetite is associated with alcohol withdrawal symptoms. Erik Velasquez expressed understanding. Erik Velasquez also shared that he struggles with symptoms of depression and occasionally "wishes he was not here". Erik Velasquez denies any suicidal intent, plans or previous attempts.  ? ?Dr. Serafina Mitchell and Erik Velasquez also discussed starting Remeron at night to assist with his appetite, depressive symptoms and sleeping habits.  ? ?Erik Velasquez shared with LCSW that he planned on following up with an outpatient substance abuse treatment and/or counseling program to ensure continuity of care. Erik Velasquez denied participating in any residential treatment programs at  this time.  ? ? ?2:00PM ?LCSW was informed that the patient was requesting discharge at this time.  ? ?Erik Velasquez signed himself out AMA and shared that his father is picking him up at discharge. LCSW charted appropriate follow up resources in the patient's AVS.    ? ?Radonna Ricker, MSW, LCSW ?Clinical Education officer, museum Insurance claims handler) ?[facility]  ? ?

## 2021-12-03 NOTE — ED Notes (Signed)
Pt resting quietly in bed.  Denies pain at this time.  Reports NV, prn zofran given.  Pt denies SI, HI, and AVH. Breathing is even and unlabored.  Will continue to monitor for safety.  ?

## 2021-12-03 NOTE — ED Notes (Signed)
Pt refused to attend group.

## 2021-12-03 NOTE — ED Notes (Signed)
Pt up to nurse's station requesting grape juice and medication for nausea and anxiety. RN provided juice and PRN medications per MAR. No signs of acute distress noted. Will continue to monitor for safety.  ?

## 2021-12-03 NOTE — Discharge Instructions (Addendum)
?  Please complete the Ativan taper as below ?5/12 1mg  at 6 PM followed by 1 mg at 10 PM ?5/13 1 mg at 10 AM followed by 1 mg at 4 PM followed by 1 mg at 10 PM ?5/14 1 mg at 10 AM followed by 1 mg at 10 PM ?5/15  1 mg at 10 AM ? ? ?Take all medications as prescribed by his/her mental healthcare provider. ?Report any adverse effects and or reactions from the medicines to your outpatient provider promptly. ?Do not engage in alcohol and or illegal drug use while on prescription medicines. ?In the event of worsening symptoms, call the crisis hotline, 911 and or go to the nearest ED for appropriate evaluation and treatment of symptoms. ?follow-up with your primary care provider for your other medical issues, concerns and or health care needs. ? ?Refills will not be given by the physician you saw on this admission. Please follow up with your outpatient provider for refills. If you do  not have a psychiatrist or psychiatry provider, you may ask your primary care provider for assistance with refills ? ?

## 2021-12-03 NOTE — ED Provider Notes (Signed)
FBC/OBS ASAP Discharge Summary ? ?Date and Time: 12/03/2021 2:04 PM  ?Name: Erik Velasquez  ?MRN:  [id number]  ? ?Discharge Diagnoses:  ?Final diagnoses:  ?Substance induced mood disorder (HCC)  ?Alcohol use disorder  ?Alcohol withdrawal syndrome without complication (HCC)  ? ? ?Subjective:  ?Patient seen earlier this AM and in the afternoon- see progress note. [facility] but later requested to discharge. CIWA score decreased from 12 to 7. Patient states that his symptoms have improved to the point where he would like to be discharged from the facility.  Discussed with patient that the [facility] remains [facility] so that he could be monitored from a medical standpoint.  Discussed with patient that he would be provided in the remainder of his Ativan taper on discharge; however, discussed the risks of leaving including but not limited to worsening of withdrawal symptoms, seizure and death.  Discussed with patient that he has never went through detox before and that it would be safest to remain [facility] a medical [facility] detox-patient verbalized understanding but continued to request discharge.. Patient states that he will make an appointment with his primary care doctor on Monday and plans to follow up with him regarding medical issues .  Patient was informed that if his withdrawal symptoms worsen or that if he experiences seizures or other withdrawal related symptoms it is recommended that he present to the ED for evaluation as alcohol withdrawal can be a medical emergency.  Patient verbalized understanding was in agreement. ? ?Stay Summary:  ?Erik DexterChristopher D Kaestner is a 41 year old male [facility] voluntary as a walk-in accompanied by his wife and parents with a chief complaint of requesting alcohol detox.[facility]  [facility]; ativan taper with CIWA protocol initated. Etoh 319; UDS +THC.  Patient tolerated initiation of Ativan taper well.  CIWA decreased from 12-7.  On 12/03/2021, discussed depressive symptoms with patient and patient was agreeable to stop Zoloft and is to start Remeron 15 mg nightly.  However, patient requested discharge on this date prior to starting medication-see above for additional details.  Patient was not provided Remeron on discharge as he did not take during his hospitalization.  Discussed the risks of leaving and advised patient that he stay and complete taper in a supervised setting; however, patient  continued to request discharge; he was able to verbalize the risks and benefits of leaving.  Patient was agreeable to sign an AMA form.  Patient was provided with prescription for Ativan for the remainder of his taper as well as Zofran as needed for nausea.  Patient was advised to present to the ED for worsening withdrawal symptoms..  Patient stated that he will make an appointment with his PCP on Monday to discuss starting Remeron. ? ?Total Time spent with patient: 20 minutes ? ?Past Psychiatric History: MDD, anxiety ?Past Medical History:  ?Past Medical History:  ?Diagnosis Date  ? History of chickenpox   ? Hypertension   ?  ?Past Surgical History:  ?Procedure Laterality Date  ? CHOLECYSTECTOMY    ? ORTHOPEDIC SURGERY  2012  ? broken leg  ? ?Family History:  ?Family History  ?Problem Relation Age of Onset  ? Diabetes Mother   ? Healthy Mother   ? Healthy Father   ?  Healthy Sister   ? Healthy Maternal Grandmother   ? Alzheimer's disease Maternal Grandfather   ? Healthy Son   ? ?Family Psychiatric History: denies ?Social History:  ?Social History  ? ?Substance and Sexual Activity  ?Alcohol Use Yes  ? Comment: 2 drinks (beer) per week  ?   ?Social History  ? ?Substance and Sexual Activity  ?Drug Use No  ?  ?Social History  ? ?Socioeconomic History  ?  Marital status: Married  ?  Spouse name: Not on file  ? Number of children: 1  ? Years of education: Not on file  ? Highest education level: Not on file  ?Occupational History  ? Not on file  ?Tobacco Use  ? Smoking status: Every Day  ?  Packs/day: 0.50  ?  Years: 12.00  ?  Pack years: 6.00  ?  Types: Cigarettes  ? Smokeless tobacco: Never  ?Vaping Use  ? Vaping Use: Never used  ?Substance and Sexual Activity  ? Alcohol use: Yes  ?  Comment: 2 drinks (beer) per week  ? Drug use: No  ? Sexual activity: Yes  ?Other Topics Concern  ? Not on file  ?Social History Narrative  ? Not on file  ? ?Social Determinants of Health  ? ?Financial Resource Strain: Not on file  ?Food Insecurity: Not on file  ?Transportation Needs: Not on file  ?Physical Activity: Not on file  ?Stress: Not on file  ?Social Connections: Not on file  ? ?SDOH:  ?SDOH Screenings  ? ?Alcohol Screen: Medium Risk  ? Last Alcohol Screening Score (AUDIT): 19  ?Depression (PHQ2-9): Medium Risk  ? PHQ-2 Score: 20  ?Financial Resource Strain: Not on file  ?Food Insecurity: Not on file  ?Housing: Not on file  ?Physical Activity: Not on file  ?Social Connections: Not on file  ?Stress: Not on file  ?Tobacco Use: High Risk  ? Smoking Tobacco Use: Every Day  ? Smokeless Tobacco Use: Never  ? Passive Exposure: Not on file  ?Transportation Needs: Not on file  ? ? ?Tobacco Cessation:  Prescription not provided because: declined ? ?Current Medications:  ?Current Facility-Administered Medications  ?Medication Dose Route Frequency Provider Last Rate Last Admin  ? hydrOXYzine (ATARAX) tablet 25 mg  25 mg Oral TID PRN White, Patrice L, NP   25 mg at 12/03/21 0201  ? lisinopril (ZESTRIL) tablet 20 mg  20 mg Oral Daily White, Patrice L, NP   20 mg at 12/03/21 5465  ? loperamide (IMODIUM) capsule 2-4 mg  2-4 mg Oral PRN White, Chrystine Oiler, NP      ? LORazepam (ATIVAN) tablet 1 mg  1 mg Oral Q6H PRN White, Patrice L, NP   1 mg at 12/03/21 0631  ? LORazepam (ATIVAN) tablet 1 mg  1  mg Oral QID White, Patrice L, NP   1 mg at 12/03/21 1329  ? Followed by  ? [START ON 12/04/2021] LORazepam (ATIVAN) tablet 1 mg  1 mg Oral TID White, Patrice L, NP      ? Followed by  ? [START ON 12/05/2021] LORazepam (ATIVAN) tablet 1 mg  1 mg Oral BID White, Patrice L, NP      ? Followed by  ? [START ON 12/06/2021] LORazepam (ATIVAN) tablet 1 mg  1 mg Oral Daily White, Patrice L, NP      ? mirtazapine (REMERON) tablet 15 mg  15 mg Oral Daily Estella Husk, MD      ? multivitamin with minerals tablet 1  tablet  1 tablet Oral Daily White, Patrice L, NP   1 tablet at 12/03/21 9381  ? nicotine (NICODERM CQ - dosed in mg/24 hours) patch 21 mg  21 mg Transdermal Daily White, Patrice L, NP   21 mg at 12/03/21 8299  ? ondansetron (ZOFRAN-ODT) disintegrating tablet 4 mg  4 mg Oral Q6H PRN White, Patrice L, NP   4 mg at 12/03/21 3716  ? pantoprazole (PROTONIX) EC tablet 40 mg  40 mg Oral Daily White, Patrice L, NP   40 mg at 12/03/21 9678  ? thiamine tablet 100 mg  100 mg Oral Daily White, Patrice L, NP   100 mg at 12/03/21 9381  ? traZODone (DESYREL) tablet 50 mg  50 mg Oral QHS PRN Sindy Guadeloupe, NP   50 mg at 12/02/21 2111  ? ?Current Outpatient Medications  ?Medication Sig Dispense Refill  ? lisinopril (ZESTRIL) 20 MG tablet TAKE 1 TABLET(20 MG) BY MOUTH DAILY 30 tablet 0  ? LORazepam (ATIVAN) 1 MG tablet Take 1 tablet (1 mg total) by mouth as directed for 8 doses. Ativan taper ?5/12 1mg  at 6 PM followed by 1 mg at 10 PM ?5/13 1 mg at 10 AM followed by 1 mg at 4 PM followed by 1 mg at 10 PM ?5/14 1 mg at 10 AM followed by 1 mg at 10 PM ?5/15  1 mg at 10 AM 8 tablet 0  ? [START ON 12/04/2021] Multiple Vitamin (MULTIVITAMIN WITH MINERALS) TABS tablet Take 1 tablet by mouth daily. 30 tablet 0  ? ondansetron (ZOFRAN-ODT) 4 MG disintegrating tablet Take 1 tablet (4 mg total) by mouth every 6 (six) hours as needed for nausea or vomiting. 20 tablet 0  ? pantoprazole (PROTONIX) 40 MG tablet TAKE 1 TABLET(40 MG) BY MOUTH DAILY  90 tablet 1  ? sertraline (ZOLOFT) 25 MG tablet Take 1 tablet (25 mg total) by mouth daily. 30 tablet 3  ? [START ON 12/04/2021] thiamine 100 MG tablet Take 1 tablet (100 mg total) by mouth daily. 30 table

## 2021-12-03 NOTE — ED Notes (Signed)
Pt vomited x1. CIWA 12. PRN Ativan and sips of water given. No signs of acute distress noted. Will continue to monitor for safety.  ?

## 2021-12-03 NOTE — ED Notes (Signed)
Patient was offered meal but declined. ?

## 2021-12-03 NOTE — ED Notes (Signed)
Patient was offered breakfast but declined.  ?

## 2021-12-03 NOTE — ED Provider Notes (Signed)
Behavioral Health Progress Note ? ?Date and Time: 12/03/2021 11:56 AM ?Name: Erik Velasquez ?MRN:  [id number] ? ?Subjective:   ?Erik Velasquez is a 41 year old male who presented to the [facility] behavioral health urgent care on 12/02/21 voluntary as a walk-in accompanied by his wife and parents with a chief complaint of requesting alcohol detox.Patient was admitted to the [facility] for detox and crisis stabilization; ativan taper with CIWA protocol initated. Etoh 319; UDS +THC ? ?Patient seen and chart reviewed-patient has been medication compliant and is been appropriate with staff and peers on the unit.  Most recent CIWA score of 12.  Patient interviewed in conjunction with LCSW this morning.  Patient describes his mood as "making it".  He recounts what led to presentation yesterday as per H&P.  Patient states that he had his gallbladder taken out approximately 11 years ago and since this time he has had anxiety regarding trips and being in the car.  Patient states that he began to drink vodka at night which then began to escalate to daytime consumption of vodka and at lunch.  Patient states that he has been drinking half 1/5 daily for the last 2 years.  Patient reports current alcohol withdrawal symptoms of tremors, anxiety, diaphoresis, palpitations, anorexia, nausea and vomiting.  He states that he vomited twice today.  Patient with Gwynneth Macleod at bedside-patient reports he has been able to eat a couple without incident.  Patient reports a decrease in weight and reports that he has lost approximately 20 pounds in the last 2.5 weeks.  Patient states that earlier in the week he had a stomach bug as well which could possibly be contributing.  Patient reports sleeping poorly last night and attributes this to not drinking alcohol.  He denies SI/HI/AH.  Patient reports he has been experiencing visual hallucinations of "people" when he closes his eyes.  Patient states that that he would like to detox and then  follow up with outpatient substance use resources.  He is not currently interested in residential rehab; however, would be interested on receiving information on these pertinent referrals. ? ?Patient reports that he has been coughing up yellow phlegm every morning and states he has acid reflux.  Patient states he has a GI doctor and states that he plans to follow up with him.  Discussed medications with patient-patient reports that prior to admission he had been intermittently compliant with all of his home medications.  Patient expresses interest in changing his Zoloft to a different medication.  Patient is agreeable to starting Remeron 15 mg after discussing R/B/AE/SE.  Discussed with patient that Remeron may help with low appetite, poor sleep and that it also has antiemetic properties.  Discussed with patient the recommendation to remain in the [facility] to complete detox-patient verbalizes understanding and is in agreement.   ? ? ? ? ?Diagnosis:  ?Final diagnoses:  ?Substance induced mood disorder (HCC)  ?Alcohol use disorder  ? ? ?Total Time spent with patient: 30 minutes ? ?Past Psychiatric History: MDD, anxiety ?Past Medical History:  ?Past Medical History:  ?Diagnosis Date  ? History of chickenpox   ? Hypertension   ?  ?Past Surgical History:  ?Procedure Laterality Date  ? CHOLECYSTECTOMY    ? ORTHOPEDIC SURGERY  2012  ? broken leg  ? ?Family History:  ?Family History  ?Problem Relation Age of Onset  ? Diabetes Mother   ? Healthy Mother   ? Healthy Father   ? Healthy Sister   ? Healthy  Maternal Grandmother   ? Alzheimer's disease Maternal Grandfather   ? Healthy Son   ? ?Family Psychiatric  History:   ?Denies psychiatric history ?Denies substance use history ?Denies history of attempted or completed suicides ? ?Social History:  ?Social History  ? ?Substance and Sexual Activity  ?Alcohol Use Yes  ? Comment: 2 drinks (beer) per week  ?   ?Social History  ? ?Substance and Sexual Activity  ?Drug Use No  ?  ?Social  History  ? ?Socioeconomic History  ? Marital status: Married  ?  Spouse name: Not on file  ? Number of children: 1  ? Years of education: Not on file  ? Highest education level: Not on file  ?Occupational History  ? Not on file  ?Tobacco Use  ? Smoking status: Every Day  ?  Packs/day: 0.50  ?  Years: 12.00  ?  Pack years: 6.00  ?  Types: Cigarettes  ? Smokeless tobacco: Never  ?Vaping Use  ? Vaping Use: Never used  ?Substance and Sexual Activity  ? Alcohol use: Yes  ?  Comment: 2 drinks (beer) per week  ? Drug use: No  ? Sexual activity: Yes  ?Other Topics Concern  ? Not on file  ?Social History Narrative  ? Not on file  ? ?Social Determinants of Health  ? ?Financial Resource Strain: Not on file  ?Food Insecurity: Not on file  ?Transportation Needs: Not on file  ?Physical Activity: Not on file  ?Stress: Not on file  ?Social Connections: Not on file  ? ?SDOH:  ?SDOH Screenings  ? ?Alcohol Screen: Medium Risk  ? Last Alcohol Screening Score (AUDIT): 19  ?Depression (PHQ2-9): Medium Risk  ? PHQ-2 Score: 20  ?Financial Resource Strain: Not on file  ?Food Insecurity: Not on file  ?Housing: Not on file  ?Physical Activity: Not on file  ?Social Connections: Not on file  ?Stress: Not on file  ?Tobacco Use: High Risk  ? Smoking Tobacco Use: Every Day  ? Smokeless Tobacco Use: Never  ? Passive Exposure: Not on file  ?Transportation Needs: Not on file  ? ?Additional Social History:  ?  ?Pain Medications: see MAR ?Prescriptions: see MAR ?Over the Counter: see MAR ?History of alcohol / drug use?: Yes ?Longest period of sobriety (when/how long): none reported ?Negative Consequences of Use: Personal relationships, Work / School ?Withdrawal Symptoms: Change in blood pressure ?Name of Substance 1: alcohol ?1 - Age of First Use: 18 ?1 - Amount (size/oz): fifth ?1 - Frequency: daily ?1 - Duration: for the past year ?1 - Last Use / Amount: 10 oz this morning ?1 - Method of Aquiring: purchases from the store ?1- Route of Use: oral ?  ?   ?  ?  ?  ?  ?  ?  ? ?Sleep: Poor ? ?Appetite:  Poor ? ?Current Medications:  ?Current Facility-Administered Medications  ?Medication Dose Route Frequency Provider Last Rate Last Admin  ? hydrOXYzine (ATARAX) tablet 25 mg  25 mg Oral TID PRN White, Patrice L, NP   25 mg at 12/03/21 0201  ? lisinopril (ZESTRIL) tablet 20 mg  20 mg Oral Daily White, Patrice L, NP   20 mg at 12/03/21 [id number]  ? loperamide (IMODIUM) capsule 2-4 mg  2-4 mg Oral PRN White, Chrystine OilerPatrice L, NP      ? LORazepam (ATIVAN) tablet 1 mg  1 mg Oral Q6H PRN White, Patrice L, NP   1 mg at 12/03/21 0631  ? LORazepam (ATIVAN) tablet 1  mg  1 mg Oral QID White, Patrice L, NP   1 mg at 12/03/21 8756  ? Followed by  ? [START ON 12/04/2021] LORazepam (ATIVAN) tablet 1 mg  1 mg Oral TID White, Patrice L, NP      ? Followed by  ? [START ON 12/05/2021] LORazepam (ATIVAN) tablet 1 mg  1 mg Oral BID White, Patrice L, NP      ? Followed by  ? [START ON 12/06/2021] LORazepam (ATIVAN) tablet 1 mg  1 mg Oral Daily White, Patrice L, NP      ? mirtazapine (REMERON) tablet 15 mg  15 mg Oral Daily Estella Husk, MD      ? multivitamin with minerals tablet 1 tablet  1 tablet Oral Daily White, Patrice L, NP   1 tablet at 12/03/21 4332  ? nicotine (NICODERM CQ - dosed in mg/24 hours) patch 21 mg  21 mg Transdermal Daily White, Patrice L, NP   21 mg at 12/03/21 9518  ? ondansetron (ZOFRAN-ODT) disintegrating tablet 4 mg  4 mg Oral Q6H PRN White, Patrice L, NP   4 mg at 12/03/21 8416  ? pantoprazole (PROTONIX) EC tablet 40 mg  40 mg Oral Daily White, Patrice L, NP   40 mg at 12/03/21 6063  ? thiamine tablet 100 mg  100 mg Oral Daily White, Patrice L, NP   100 mg at 12/03/21 0160  ? traZODone (DESYREL) tablet 50 mg  50 mg Oral QHS PRN Sindy Guadeloupe, NP   50 mg at 12/02/21 2111  ? ?Current Outpatient Medications  ?Medication Sig Dispense Refill  ? lisinopril (ZESTRIL) 20 MG tablet TAKE 1 TABLET(20 MG) BY MOUTH DAILY 30 tablet 0  ? pantoprazole (PROTONIX) 40 MG tablet TAKE 1  TABLET(40 MG) BY MOUTH DAILY 90 tablet 1  ? sertraline (ZOLOFT) 25 MG tablet Take 1 tablet (25 mg total) by mouth daily. 30 tablet 3  ? ? ?Labs  ?Lab Results:  ?Admission on 12/02/2021  ?Component Date Value R

## 2021-12-03 NOTE — ED Notes (Signed)
Pt asleep in bed. Respirations even and unlabored. Will continue to monitor for safety. ?

## 2021-12-06 ENCOUNTER — Encounter: Payer: Self-pay | Admitting: Registered Nurse

## 2021-12-06 ENCOUNTER — Ambulatory Visit: Payer: BC Managed Care – PPO | Admitting: Registered Nurse

## 2021-12-06 VITALS — BP 157/103 | HR 94 | Temp 98.1°F | Resp 18 | Ht 71.0 in | Wt 239.0 lb

## 2021-12-06 DIAGNOSIS — R748 Abnormal levels of other serum enzymes: Secondary | ICD-10-CM | POA: Diagnosis not present

## 2021-12-06 DIAGNOSIS — F411 Generalized anxiety disorder: Secondary | ICD-10-CM

## 2021-12-06 DIAGNOSIS — R1013 Epigastric pain: Secondary | ICD-10-CM | POA: Diagnosis not present

## 2021-12-06 LAB — CBC WITH DIFFERENTIAL/PLATELET
Basophils Absolute: 0 10*3/uL (ref 0.0–0.1)
Basophils Relative: 0.4 % (ref 0.0–3.0)
Eosinophils Absolute: 0.1 10*3/uL (ref 0.0–0.7)
Eosinophils Relative: 0.9 % (ref 0.0–5.0)
HCT: 42.7 % (ref 39.0–52.0)
Hemoglobin: 14.5 g/dL (ref 13.0–17.0)
Lymphocytes Relative: 16.8 % (ref 12.0–46.0)
Lymphs Abs: 1.5 10*3/uL (ref 0.7–4.0)
MCHC: 34 g/dL (ref 30.0–36.0)
MCV: 101.4 fl — ABNORMAL HIGH (ref 78.0–100.0)
Monocytes Absolute: 0.6 10*3/uL (ref 0.1–1.0)
Monocytes Relative: 7.1 % (ref 3.0–12.0)
Neutro Abs: 6.8 10*3/uL (ref 1.4–7.7)
Neutrophils Relative %: 74.8 % (ref 43.0–77.0)
Platelets: 225 10*3/uL (ref 150.0–400.0)
RBC: 4.22 Mil/uL (ref 4.22–5.81)
RDW: 13 % (ref 11.5–15.5)
WBC: 9.1 10*3/uL (ref 4.0–10.5)

## 2021-12-06 MED ORDER — FAMOTIDINE 20 MG PO TABS: 20.0000 mg | ORAL_TABLET | Freq: Two times a day (BID) | ORAL | 0 refills | Status: AC

## 2021-12-06 MED ORDER — TRAZODONE HCL 50 MG PO TABS: 25.0000 mg | ORAL_TABLET | Freq: Every evening | ORAL | 3 refills | Status: DC | PRN

## 2021-12-06 MED ORDER — SUCRALFATE 1 GM/10ML PO SUSP: 1.0000 g | Freq: Three times a day (TID) | ORAL | 0 refills | Status: DC

## 2021-12-06 MED ORDER — FLUOXETINE HCL 20 MG PO CAPS: 20.0000 mg | ORAL_CAPSULE | Freq: Every day | ORAL | 3 refills | Status: DC

## 2021-12-06 NOTE — Progress Notes (Signed)
? ?Established Patient Office Visit ? ?Subjective:  ?Patient ID: Erik Velasquez, male    DOB: [birth year]  Age: 41 y.o. MRN: [id number] ? ?CC:  ?Chief Complaint  ?Patient presents with  ? Follow-up  ?  Patient states he is hrere for a follow up and discuss medications  ? ? ?HPI ?Erik Velasquez presents for follow up, discuss meds. ? ?Depression and Anxiety ?A lot of baseline anxiety, worse with GERD ?Had been on sertraline but subtherapeutic effect. ? ?Etoh abuse ?Self-treating for anxieties. ?Last drink was on Thursday at noon.  ?Went to ER for intoxication - concern for withdrawals.  ?Has been on lorazepam taper with good effect.  ? ?GERD ?Worsening. ?Vomiting every morning - bilious ?Leading to poor appetite ?Has been on ondansetron intermittently with good effect.  ? ?Outpatient Medications Prior to Visit  ?Medication Sig Dispense Refill  ? lisinopril (ZESTRIL) 20 MG tablet TAKE 1 TABLET(20 MG) BY MOUTH DAILY 30 tablet 0  ? LORazepam (ATIVAN) 1 MG tablet Take 1 tablet (1 mg total) by mouth as directed for 8 doses. Ativan taper ?5/12 1mg  at 6 PM followed by 1 mg at 10 PM ?5/13 1 mg at 10 AM followed by 1 mg at 4 PM followed by 1 mg at 10 PM ?5/14 1 mg at 10 AM followed by 1 mg at 10 PM ?5/15  1 mg at 10 AM 8 tablet 0  ? Multiple Vitamin (MULTIVITAMIN WITH MINERALS) TABS tablet Take 1 tablet by mouth daily. 30 tablet 0  ? ondansetron (ZOFRAN-ODT) 4 MG disintegrating tablet Take 1 tablet (4 mg total) by mouth every 6 (six) hours as needed for nausea or vomiting. 20 tablet 0  ? pantoprazole (PROTONIX) 40 MG tablet TAKE 1 TABLET(40 MG) BY MOUTH DAILY 90 tablet 1  ? thiamine 100 MG tablet Take 1 tablet (100 mg total) by mouth daily. 30 tablet 0  ? sertraline (ZOLOFT) 25 MG tablet Take 1 tablet (25 mg total) by mouth daily. 30 tablet 3  ? ?No facility-administered medications prior to visit.  ? ? ?Review of Systems  ?Constitutional: Negative.   ?HENT: Negative.    ?Eyes: Negative.   ?Respiratory: Negative.     ?Cardiovascular: Negative.   ?Gastrointestinal:  Positive for abdominal pain and vomiting.  ?Endocrine: Negative.   ?Genitourinary: Negative.   ?Musculoskeletal: Negative.   ?Skin: Negative.   ?Allergic/Immunologic: Negative.   ?Neurological: Negative.   ?Hematological: Negative.   ?Psychiatric/Behavioral:  The patient is nervous/anxious.   ?All other systems reviewed and are negative. ? ?  ?Objective:  ?  ? ?BP (!) 157/103   Pulse 94   Temp 98.1 ?F (36.7 ?C) (Temporal)   Resp 18   Ht 5\' 11"  (1.803 m)   Wt 239 lb (108.4 kg)   SpO2 100%   BMI 33.33 kg/m?  ? ?Wt Readings from Last 3 Encounters:  ?12/06/21 239 lb (108.4 kg)  ?05/17/21 237 lb (107.5 kg)  ?05/05/21 237 lb 12.8 oz (107.9 kg)  ? ?Physical Exam ?Constitutional:   ?   General: He is not in acute distress. ?   Appearance: Normal appearance. He is normal weight. He is not ill-appearing, toxic-appearing or diaphoretic.  ?Cardiovascular:  ?   Rate and Rhythm: Normal rate and regular rhythm.  ?   Heart sounds: Normal heart sounds. No murmur heard. ?  No friction rub. No gallop.  ?Pulmonary:  ?   Effort: Pulmonary effort is normal. No respiratory distress.  ?   Breath sounds: Normal  breath sounds. No stridor. No wheezing, rhonchi or rales.  ?Chest:  ?   Chest wall: No tenderness.  ?Neurological:  ?   General: No focal deficit present.  ?   Mental Status: He is alert and oriented to person, place, and time. Mental status is at baseline.  ?Psychiatric:     ?   Mood and Affect: Mood normal.     ?   Behavior: Behavior normal.     ?   Thought Content: Thought content normal.     ?   Judgment: Judgment normal.  ? ? ?No results found for any visits on 12/06/21. ? ? ? ?The ASCVD Risk score (Arnett DK, et al., 2019) failed to calculate for the following reasons: ?  The valid HDL cholesterol range is 20 to 100 mg/dL ? ?  ?Assessment & Plan:  ? ?Problem List Items Addressed This Visit   ?None ?Visit Diagnoses   ? ? GAD (generalized anxiety disorder)    -  Primary  ?  Relevant Medications  ? FLUoxetine (PROZAC) 20 MG capsule  ? traZODone (DESYREL) 50 MG tablet  ? Epigastric pain      ? Relevant Medications  ? sucralfate (CARAFATE) 1 GM/10ML suspension  ? famotidine (PEPCID) 20 MG tablet  ? Other Relevant Orders  ? CBC with Differential/Platelet  ? Comprehensive metabolic panel  ? H. pylori antibody, IgG  ? CT Abdomen Pelvis W Contrast  ? Elevated liver enzymes      ? Relevant Orders  ? CBC with Differential/Platelet  ? Comprehensive metabolic panel  ? CT Abdomen Pelvis W Contrast  ? ?  ? ? ?Meds ordered this encounter  ?Medications  ? FLUoxetine (PROZAC) 20 MG capsule  ?  Sig: Take 1 capsule (20 mg total) by mouth daily.  ?  Dispense:  90 capsule  ?  Refill:  3  ?  Order Specific Question:   Supervising Provider  ?  Answer:   Neva Seat, JEFFREY R [[id number]]  ? traZODone (DESYREL) 50 MG tablet  ?  Sig: Take 0.5-1 tablets (25-50 mg total) by mouth at bedtime as needed for sleep.  ?  Dispense:  30 tablet  ?  Refill:  3  ?  Order Specific Question:   Supervising Provider  ?  Answer:   Neva Seat, JEFFREY R [[id number]]  ? sucralfate (CARAFATE) 1 GM/10ML suspension  ?  Sig: Take 10 mLs (1 g total) by mouth 4 (four) times daily -  with meals and at bedtime.  ?  Dispense:  420 mL  ?  Refill:  0  ?  Order Specific Question:   Supervising Provider  ?  Answer:   Neva Seat, JEFFREY R [[id number]]  ? famotidine (PEPCID) 20 MG tablet  ?  Sig: Take 1 tablet (20 mg total) by mouth 2 (two) times daily.  ?  Dispense:  180 tablet  ?  Refill:  0  ?  Order Specific Question:   Supervising Provider  ?  Answer:   Neva Seat, JEFFREY R [[id number]]  ? ? ?Return in about 6 weeks (around 01/17/2022) for med check.  ? ?PLAN ?Repeat labs. Follow up as indicated. ?Start fluoxetine daily. Med check 4-6 weeks ?Start trazodone 25-50mg  po qhs prn for sleep. ?Start famotidine 20mg  po bid ?Start sucralfate qid ?Reviewed risks, benefits, and side effects of all meds, pt voices understanding. ?CT abd/pel w contrast to help rule out underlying  contributor to nausea with bilious vomiting, poor appetite, weight loss.  ?Encouraged patient to continue abstinence  from alcohol.  ?Patient encouraged to call clinic with any questions, comments, or concerns. ? ? ? ?Janeece Agee, NP ?

## 2021-12-06 NOTE — Patient Instructions (Addendum)
Mr. Fauth -  ? ?Great to see you ? ?Fluoxetine - daily - may take 5-6 weeks to show effect. Let's touch base around then ? ?Trazodone about 30-60 minutes before bed as needed. ? ?Sucralfate before each meal and before bed for next 10 days ? ?Famotidine twice daily for ten days. ? ?Call if you need anything ? ?Thanks, ? ?Rich  ?

## 2021-12-07 LAB — COMPREHENSIVE METABOLIC PANEL
ALT: 101 U/L — ABNORMAL HIGH (ref 0–53)
AST: 68 U/L — ABNORMAL HIGH (ref 0–37)
Albumin: 4.4 g/dL (ref 3.5–5.2)
Alkaline Phosphatase: 66 U/L (ref 39–117)
BUN: 11 mg/dL (ref 6–23)
CO2: 25 mEq/L (ref 19–32)
Calcium: 10 mg/dL (ref 8.4–10.5)
Chloride: 103 mEq/L (ref 96–112)
Creatinine, Ser: 1.06 mg/dL (ref 0.40–1.50)
GFR: 87.39 mL/min (ref >60.00)
Glucose, Bld: 97 mg/dL (ref 70–99)
Potassium: 4.1 mEq/L (ref 3.5–5.1)
Sodium: 140 mEq/L (ref 135–145)
Total Bilirubin: 1.1 mg/dL (ref 0.2–1.2)
Total Protein: 6.8 g/dL (ref 6.0–8.3)

## 2021-12-07 LAB — H. PYLORI ANTIBODY, IGG: H Pylori IgG: NEGATIVE

## 2021-12-10 ENCOUNTER — Encounter: Payer: Self-pay | Admitting: Gastroenterology

## 2021-12-15 ENCOUNTER — Encounter: Payer: Self-pay | Admitting: Gastroenterology

## 2021-12-15 ENCOUNTER — Ambulatory Visit: Payer: BC Managed Care – PPO | Admitting: Gastroenterology

## 2021-12-15 VITALS — BP 140/78 | HR 83 | Ht 71.0 in | Wt 252.4 lb

## 2021-12-15 DIAGNOSIS — K700 Alcoholic fatty liver: Secondary | ICD-10-CM | POA: Diagnosis not present

## 2021-12-15 DIAGNOSIS — K219 Gastro-esophageal reflux disease without esophagitis: Secondary | ICD-10-CM | POA: Diagnosis not present

## 2021-12-15 DIAGNOSIS — R112 Nausea with vomiting, unspecified: Secondary | ICD-10-CM

## 2021-12-15 DIAGNOSIS — F1091 Alcohol use, unspecified, in remission: Secondary | ICD-10-CM | POA: Diagnosis not present

## 2021-12-15 NOTE — Patient Instructions (Addendum)
If you are age 42 or older, your body mass index should be between 23-30. Your Body mass index is 35.2 kg/m. If this is out of the aforementioned range listed, please consider follow up with your Primary Care Provider.  If you are age 71 or younger, your body mass index should be between 19-25. Your Body mass index is 35.2 kg/m. If this is out of the aformentioned range listed, please consider follow up with your Primary Care Provider.   You have been scheduled for an endoscopy. Please follow written instructions given to you at your visit today. If you use inhalers (even only as needed), please bring them with you on the day of your procedure.  Continue Protonix once daily.   Take Pepcid as needed.  The [place] [facility] providers would like to encourage you to use [facility] to communicate with providers for non-urgent requests or questions.  Due to long hold times on the telephone, sending your provider a message by [facility] may be a faster and more efficient way to get a response.  Please allow 48 business hours for a response.  Please remember that this is for non-urgent requests.   It was a pleasure to see you today!  Thank you for trusting me with your gastrointestinal care!    Scott E.Candis Schatz, MD

## 2021-12-15 NOTE — Progress Notes (Signed)
HPI : Erik Velasquez is a very pleasant 41 year old male who I saw initially May 05, 2021 for further evaluation of nausea and vomiting.  At that time, he had been drinking heavily to help manage his stress and anxiety.  He had made decision to stop drinking, as it was thought to contribute significantly to his nausea and vomiting symptoms.  He was scheduled for an upper endoscopy in December, but had to cancel because of COVID.  Right upper quadrant ultrasound revealed diffuse hepatic steatosis consistent with early alcohol induced liver disease.  His liver enzymes had been elevated chronically, but had normalized on October 24 in the setting of alcohol abstinence. The patient states that he remained abstinent from alcohol for several months, but then started drinking again in the last 1 to 2 months.  When he started drinking again, his nausea and vomiting significantly worsened.  He went to the emergency department 3 weeks ago with a blood alcohol level of 319 mg/dL. His liver enzymes are once again elevated, with AST 203, ALT 174, normal T. bili and alk phos.  The patient states that he has not drank alcohol since presenting to the emergency department that day.  Since stopping drinking, his nausea and vomiting have again improved drastically.  He does continue to have symptoms of heartburn on occasion.  He also has symptoms of a morning cough and occasional dry heaves upon awakening.  The symptoms are more noticeable when he overeats in the late evenings, or eats particularly greasy or spicy foods.  He denies any abdominal pain.  His bowel movements are normal, with formed brown stools.  No blood in the stool.  His appetite is good.  He had lost 15 to 20 pounds by the time he presented to the emergency department, but he has since regained this weight and is now back to his normal weight. His primary care provider is helping him manage his anxiety with medications which he says is helping. He is  taking Protonix daily and Pepcid twice daily currently.  Past Medical History:  Diagnosis Date   Anxiety    History of chickenpox    Hypertension      Past Surgical History:  Procedure Laterality Date   CHOLECYSTECTOMY     ORTHOPEDIC SURGERY  2012   broken leg   Family History  Problem Relation Age of Onset   Diabetes Mother    Healthy Mother    Healthy Father    Healthy Sister    Healthy Maternal Grandmother    Alzheimer's disease Maternal Grandfather    Healthy Son    Social History   Tobacco Use   Smoking status: Every Day    Packs/day: 0.50    Years: 12.00    Pack years: 6.00    Types: Cigarettes   Smokeless tobacco: Never  Vaping Use   Vaping Use: Never used  Substance Use Topics   Alcohol use: Yes    Comment: 2 drinks (beer) per week   Drug use: No   Current Outpatient Medications  Medication Sig Dispense Refill   famotidine (PEPCID) 20 MG tablet Take 1 tablet (20 mg total) by mouth 2 (two) times daily. 180 tablet 0   FLUoxetine (PROZAC) 20 MG capsule Take 1 capsule (20 mg total) by mouth daily. 90 capsule 3   lisinopril (ZESTRIL) 20 MG tablet TAKE 1 TABLET(20 MG) BY MOUTH DAILY 30 tablet 0   LORazepam (ATIVAN) 1 MG tablet Take 1 tablet (1 mg total) by mouth  as directed for 8 doses. Ativan taper 5/12 20m at 6 PM followed by 1 mg at 10 PM 5/13 1 mg at 10 AM followed by 1 mg at 4 PM followed by 1 mg at 10 PM 5/14 1 mg at 10 AM followed by 1 mg at 10 PM 5/15  1 mg at 10 AM 8 tablet 0   Multiple Vitamin (MULTIVITAMIN WITH MINERALS) TABS tablet Take 1 tablet by mouth daily. 30 tablet 0   ondansetron (ZOFRAN-ODT) 4 MG disintegrating tablet Take 1 tablet (4 mg total) by mouth every 6 (six) hours as needed for nausea or vomiting. 20 tablet 0   pantoprazole (PROTONIX) 40 MG tablet TAKE 1 TABLET(40 MG) BY MOUTH DAILY 90 tablet 1   sucralfate (CARAFATE) 1 GM/10ML suspension Take 10 mLs (1 g total) by mouth 4 (four) times daily -  with meals and at bedtime. 420 mL 0    thiamine 100 MG tablet Take 1 tablet (100 mg total) by mouth daily. 30 tablet 0   traZODone (DESYREL) 50 MG tablet Take 0.5-1 tablets (25-50 mg total) by mouth at bedtime as needed for sleep. 30 tablet 3   No current facility-administered medications for this visit.   No Known Allergies   Review of Systems: All systems reviewed and negative except where noted in HPI.    No results found.  Physical Exam: BP 140/78   Pulse 83   Ht _0  (1.803 m)   Wt 252 lb 6.4 oz (114.5 kg)   SpO2 99%   BMI 35.20 kg/m  Constitutional: Pleasant,well-developed, Caucasian male in no acute distress. HEENT: Normocephalic and atraumatic. Conjunctivae are normal. No scleral icterus. Extremities: no edema Neurological: Alert and oriented to person place and time. Skin: Skin is warm and dry. No rashes noted. Psychiatric: Normal mood and affect. Behavior is normal.  CBC    Component Value Date/Time   WBC 9.1 12/06/2021 0835   RBC 4.22 12/06/2021 0835   HGB 14.5 12/06/2021 0835   HCT 42.7 12/06/2021 0835   PLT 225.0 12/06/2021 0835   MCV 101.4 (H) 12/06/2021 0835   MCH 34.5 (H) 12/02/2021 1650   MCHC 34.0 12/06/2021 0835   RDW 13.0 12/06/2021 0835   LYMPHSABS 1.5 12/06/2021 0835   MONOABS 0.6 12/06/2021 0835   EOSABS 0.1 12/06/2021 0835   BASOSABS 0.0 12/06/2021 0835    CMP     Component Value Date/Time   NA 140 12/06/2021 0835   K 4.1 12/06/2021 0835   CL 103 12/06/2021 0835   CO2 25 12/06/2021 0835   GLUCOSE 97 12/06/2021 0835   BUN 11 12/06/2021 0835   CREATININE 1.06 12/06/2021 0835   CALCIUM 10.0 12/06/2021 0835   PROT 6.8 12/06/2021 0835   ALBUMIN 4.4 12/06/2021 0835   AST 68 (H) 12/06/2021 0835   ALT 101 (H) 12/06/2021 0835   ALKPHOS 66 12/06/2021 0835   BILITOT 1.1 12/06/2021 0835   GFRNONAA >60 12/02/2021 1650   GFRAA  09/25/2008 2123    >60        The eGFR has been calculated using the MDRD equation. This calculation has not been validated in all  clinical situations. eGFR's persistently <60 mL/min signify possible Chronic Kidney Disease.     ASSESSMENT AND PLAN: 41year old male with alcohol use disorder and alcohol induced fatty liver disease, with significant symptoms of nausea and vomiting which correlate with his alcohol intake.  When he stops drinking alcohol, the symptoms abate.  He does have less severe, more  chronic symptoms of GERD manifested by heartburn, morning cough and occasional dry heaves.  The symptoms are improved but not completely resolved with acid suppressive therapy.  For this reason, I do recommend we proceed with the upper endoscopy that had been originally scheduled in December.  I recommend he continue to modify his diet to avoid overeating and fatty/greasy foods.  He can continue Protonix once daily with Pepcid as needed as opposed to Pepcid twice daily.  If he finds that his symptoms are worse on this regimen, I would recommend he take Protonix twice daily. Regarding his alcohol abuse, I am hopeful that if his anxiety is controlled, this will reduce his perceived need to drink alcohol.  Recommend following up with PCP for anxiety management.  GERD -EGD as previously planned - Protonix once daily, Pepcid as needed  Alcohol use disorder, alcohol-induced fatty liver disease - Continue complete abstinence from alcohol  The details, risks (including bleeding, perforation, infection, missed lesions, medication reactions and possible hospitalization or surgery if complications occur), benefits, and alternatives to EGD with possible biopsy and possible dilation were discussed with the patient and he consents to proceed.   Leonides Minder E. Candis Schatz, MD [facility]  CC:  Wendie Agreste, MD

## 2021-12-19 ENCOUNTER — Other Ambulatory Visit: Payer: Self-pay | Admitting: Registered Nurse

## 2021-12-19 DIAGNOSIS — I10 Essential (primary) hypertension: Secondary | ICD-10-CM

## 2021-12-21 NOTE — Telephone Encounter (Signed)
Deferring to you  Thanks,  Luan Pulling

## 2021-12-21 NOTE — Telephone Encounter (Signed)
Recent labs noted. Appt pending in few weeks, refill ordered.

## 2021-12-24 ENCOUNTER — Telehealth (HOSPITAL_COMMUNITY): Payer: Self-pay | Admitting: Family Medicine

## 2021-12-24 NOTE — BH Assessment (Signed)
Care Management - [facility] Follow Up Discharges   Writer attempted to make contact with patient today and was unsuccessful.  Writer left a HIPPA compliant voice message.    Per chart review, patient will follow up with outpatient substance abuse services at the [facility].

## 2022-01-04 DIAGNOSIS — L573 Poikiloderma of Civatte: Secondary | ICD-10-CM | POA: Diagnosis not present

## 2022-01-04 DIAGNOSIS — D1801 Hemangioma of skin and subcutaneous tissue: Secondary | ICD-10-CM | POA: Diagnosis not present

## 2022-01-04 DIAGNOSIS — L570 Actinic keratosis: Secondary | ICD-10-CM | POA: Diagnosis not present

## 2022-01-04 DIAGNOSIS — L918 Other hypertrophic disorders of the skin: Secondary | ICD-10-CM | POA: Diagnosis not present

## 2022-01-11 ENCOUNTER — Encounter: Payer: BC Managed Care – PPO | Admitting: Gastroenterology

## 2022-01-17 ENCOUNTER — Ambulatory Visit: Payer: BC Managed Care – PPO | Admitting: Family Medicine

## 2022-01-27 DIAGNOSIS — F1020 Alcohol dependence, uncomplicated: Secondary | ICD-10-CM | POA: Diagnosis not present

## 2022-01-28 DIAGNOSIS — F1020 Alcohol dependence, uncomplicated: Secondary | ICD-10-CM | POA: Diagnosis not present

## 2022-01-31 DIAGNOSIS — F1020 Alcohol dependence, uncomplicated: Secondary | ICD-10-CM | POA: Diagnosis not present

## 2022-02-01 DIAGNOSIS — F1020 Alcohol dependence, uncomplicated: Secondary | ICD-10-CM | POA: Diagnosis not present

## 2022-02-02 DIAGNOSIS — F411 Generalized anxiety disorder: Secondary | ICD-10-CM | POA: Diagnosis not present

## 2022-02-02 DIAGNOSIS — F331 Major depressive disorder, recurrent, moderate: Secondary | ICD-10-CM | POA: Diagnosis not present

## 2022-02-02 DIAGNOSIS — F1020 Alcohol dependence, uncomplicated: Secondary | ICD-10-CM | POA: Diagnosis not present

## 2022-02-04 DIAGNOSIS — F1020 Alcohol dependence, uncomplicated: Secondary | ICD-10-CM | POA: Diagnosis not present

## 2022-02-07 DIAGNOSIS — F1020 Alcohol dependence, uncomplicated: Secondary | ICD-10-CM | POA: Diagnosis not present

## 2022-02-08 DIAGNOSIS — F1020 Alcohol dependence, uncomplicated: Secondary | ICD-10-CM | POA: Diagnosis not present

## 2022-02-09 DIAGNOSIS — F411 Generalized anxiety disorder: Secondary | ICD-10-CM | POA: Diagnosis not present

## 2022-02-09 DIAGNOSIS — F331 Major depressive disorder, recurrent, moderate: Secondary | ICD-10-CM | POA: Diagnosis not present

## 2022-02-09 DIAGNOSIS — F1020 Alcohol dependence, uncomplicated: Secondary | ICD-10-CM | POA: Diagnosis not present

## 2022-02-11 DIAGNOSIS — F1020 Alcohol dependence, uncomplicated: Secondary | ICD-10-CM | POA: Diagnosis not present

## 2022-02-14 DIAGNOSIS — F1020 Alcohol dependence, uncomplicated: Secondary | ICD-10-CM | POA: Diagnosis not present

## 2022-02-15 DIAGNOSIS — F1020 Alcohol dependence, uncomplicated: Secondary | ICD-10-CM | POA: Diagnosis not present

## 2022-02-16 DIAGNOSIS — F331 Major depressive disorder, recurrent, moderate: Secondary | ICD-10-CM | POA: Diagnosis not present

## 2022-02-16 DIAGNOSIS — F411 Generalized anxiety disorder: Secondary | ICD-10-CM | POA: Diagnosis not present

## 2022-02-16 DIAGNOSIS — F1020 Alcohol dependence, uncomplicated: Secondary | ICD-10-CM | POA: Diagnosis not present

## 2022-03-02 ENCOUNTER — Other Ambulatory Visit: Payer: Self-pay | Admitting: Registered Nurse

## 2022-03-02 DIAGNOSIS — K219 Gastro-esophageal reflux disease without esophagitis: Secondary | ICD-10-CM

## 2022-06-24 ENCOUNTER — Other Ambulatory Visit: Payer: Self-pay | Admitting: Family Medicine

## 2022-06-24 ENCOUNTER — Other Ambulatory Visit: Payer: Self-pay | Admitting: Lab

## 2022-06-24 DIAGNOSIS — I10 Essential (primary) hypertension: Secondary | ICD-10-CM

## 2022-09-17 DIAGNOSIS — E669 Obesity, unspecified: Secondary | ICD-10-CM | POA: Diagnosis not present

## 2022-09-17 DIAGNOSIS — J209 Acute bronchitis, unspecified: Secondary | ICD-10-CM | POA: Diagnosis not present

## 2022-09-17 DIAGNOSIS — I10 Essential (primary) hypertension: Secondary | ICD-10-CM | POA: Diagnosis not present

## 2022-09-17 DIAGNOSIS — R059 Cough, unspecified: Secondary | ICD-10-CM | POA: Diagnosis not present

## 2022-09-17 DIAGNOSIS — R509 Fever, unspecified: Secondary | ICD-10-CM | POA: Diagnosis not present

## 2022-11-07 ENCOUNTER — Other Ambulatory Visit: Payer: Self-pay | Admitting: Family Medicine

## 2022-11-07 DIAGNOSIS — K219 Gastro-esophageal reflux disease without esophagitis: Secondary | ICD-10-CM

## 2023-03-07 ENCOUNTER — Other Ambulatory Visit: Payer: Self-pay | Admitting: Family Medicine

## 2023-03-07 DIAGNOSIS — I10 Essential (primary) hypertension: Secondary | ICD-10-CM

## 2023-03-10 ENCOUNTER — Other Ambulatory Visit: Payer: Self-pay | Admitting: Family Medicine

## 2023-03-10 DIAGNOSIS — I10 Essential (primary) hypertension: Secondary | ICD-10-CM

## 2023-03-13 ENCOUNTER — Other Ambulatory Visit: Payer: Self-pay

## 2023-03-13 ENCOUNTER — Other Ambulatory Visit: Payer: Self-pay | Admitting: Family Medicine

## 2023-03-13 DIAGNOSIS — I10 Essential (primary) hypertension: Secondary | ICD-10-CM

## 2023-03-13 MED ORDER — LISINOPRIL 20 MG PO TABS: 20.0000 mg | ORAL_TABLET | Freq: Every day | ORAL | 0 refills | Status: DC

## 2023-03-16 ENCOUNTER — Telehealth: Payer: Self-pay

## 2023-03-16 NOTE — Telephone Encounter (Signed)
Called left vm ..

## 2023-03-17 ENCOUNTER — Encounter: Payer: BC Managed Care – PPO | Admitting: Family Medicine

## 2023-03-17 ENCOUNTER — Telehealth: Payer: Self-pay | Admitting: Family Medicine

## 2023-03-17 DIAGNOSIS — F411 Generalized anxiety disorder: Secondary | ICD-10-CM

## 2023-03-17 MED ORDER — TRAZODONE HCL 50 MG PO TABS
25.0000 mg | ORAL_TABLET | Freq: Every evening | ORAL | 0 refills | Status: DC | PRN
Start: 2023-03-17 — End: 2023-04-19

## 2023-03-17 MED ORDER — FLUOXETINE HCL 20 MG PO CAPS: 20.0000 mg | ORAL_CAPSULE | Freq: Every day | ORAL | 0 refills | Status: DC

## 2023-03-17 NOTE — Telephone Encounter (Signed)
More information please - what refills? Ok to refill any non-controlled meds for 30 days as upcoming appt. Let me know if controlled med needed.

## 2023-03-17 NOTE — Telephone Encounter (Signed)
Pt is requesting refills until appt.

## 2023-03-17 NOTE — Telephone Encounter (Signed)
Pt is aware.  

## 2023-03-17 NOTE — Telephone Encounter (Signed)
Erie Noe has scheduled this appt.

## 2023-03-17 NOTE — Telephone Encounter (Signed)
Prozac, trazodone temporarily ordered.

## 2023-03-17 NOTE — Telephone Encounter (Signed)
Pt states he needs his Prozac 20 mg  Requested Prescriptions    No prescriptions requested or ordered in this encounter     Date of patient request: 03/17/23 Last office visit: 12/06/21 Date of last refill: 12/06/21 Last refill amount: 90 had 3 refills  Follow up time period per chart:  will make an apt w/ Dr Neva Seat   Needs Trazadone 5o mg  Requested Prescriptions    No prescriptions requested or ordered in this encounter     Date of patient request: 03/17/23 Last office visit: 12/06/21 Date of last refill: 12/06/21 with 3 refills  Last refill amount: 30 Follow up time period per chart: will make an apt w/ Dr Neva Seat

## 2023-03-17 NOTE — Telephone Encounter (Signed)
Pt was a Erik Velasquez pt was seen by Erik Velasquez twice in 2022. But was never Est. Now needs med refills. Erik Velasquez will take him back first office visit 03/20/2023 for temporary med refill. Then probably schedule a physical to update pt. Both pt and Erik Velasquez are aware.

## 2023-03-20 ENCOUNTER — Ambulatory Visit: Payer: BC Managed Care – PPO | Admitting: Family Medicine

## 2023-04-19 ENCOUNTER — Telehealth: Payer: BC Managed Care – PPO | Admitting: Family Medicine

## 2023-04-19 ENCOUNTER — Encounter: Payer: Self-pay | Admitting: Family Medicine

## 2023-04-19 VITALS — Ht 72.0 in | Wt 235.0 lb

## 2023-04-19 DIAGNOSIS — K219 Gastro-esophageal reflux disease without esophagitis: Secondary | ICD-10-CM | POA: Diagnosis not present

## 2023-04-19 DIAGNOSIS — I10 Essential (primary) hypertension: Secondary | ICD-10-CM | POA: Diagnosis not present

## 2023-04-19 DIAGNOSIS — F1011 Alcohol abuse, in remission: Secondary | ICD-10-CM

## 2023-04-19 DIAGNOSIS — F411 Generalized anxiety disorder: Secondary | ICD-10-CM | POA: Diagnosis not present

## 2023-04-19 DIAGNOSIS — R748 Abnormal levels of other serum enzymes: Secondary | ICD-10-CM

## 2023-04-19 MED ORDER — LISINOPRIL 20 MG PO TABS: 20.0000 mg | ORAL_TABLET | Freq: Every day | ORAL | 1 refills | Status: AC

## 2023-04-19 MED ORDER — PANTOPRAZOLE SODIUM 40 MG PO TBEC: DELAYED_RELEASE_TABLET | ORAL | 1 refills | Status: DC

## 2023-04-19 MED ORDER — TRAZODONE HCL 50 MG PO TABS: 25.0000 mg | ORAL_TABLET | Freq: Every evening | ORAL | 5 refills | Status: AC | PRN

## 2023-04-19 MED ORDER — FLUOXETINE HCL 20 MG PO CAPS: 20.0000 mg | ORAL_CAPSULE | Freq: Every day | ORAL | 1 refills | Status: DC

## 2023-04-19 NOTE — Patient Instructions (Addendum)
Good talking with you today.  Congratulations on the improved health, happy to hear that things are going so well!  Okay to remain on same dose of fluoxetine for now, but as we discussed if you do have some breakthrough anxiety, or more frequent anxiety flares I think it would be reasonable to try the 40 mg dose.  Let me know if you want to make that change.  Keep a record of your blood pressures outside of the office and send me a few readings through MyChart.  See information below on how to most effectively check the blood pressure.  Okay to stay on Protonix daily with Pepcid as needed, but I do recommend contacting gastroenterology regarding endoscopy as that was recommended previously and they may still want to proceed with that study.  Dr. Tiajuana Amass [phone]  Please have lab work performed at the [facility] location within the next 1 week.  If any concerns on labs I will let you know.  As long as blood pressures are stable and labs look okay, follow-up with me in office for physical in the next 3 months.  Please let me know if there are questions and take care.  [facility] or xray: Walk in 8:30-4:30 during weekdays, no appointment needed [address]  [place], [place] [address]  How to Take Your Blood Pressure Blood pressure is a measurement of how strongly your blood is pressing against the walls of your arteries. Arteries are blood vessels that carry blood from your heart throughout your body. Your health care provider takes your blood pressure at each office visit. You can also take your own blood pressure at home with a blood pressure monitor. You may need to take your own blood pressure to: Confirm a diagnosis of high blood pressure (hypertension). Monitor your blood pressure over time. Make sure your blood pressure medicine is working. Supplies needed: Blood pressure monitor. A chair to sit in. This should be a chair where you can sit upright with your back  supported. Do not sit on a soft couch or an armchair. Table or desk. Small notebook and pencil or pen. How to prepare To get the most accurate reading, avoid the following for 30 minutes before you check your blood pressure: Drinking caffeine. Drinking alcohol. Eating. Smoking. Exercising. Five minutes before you check your blood pressure: Use the bathroom and urinate so that you have an empty bladder. Sit quietly in a chair. Do not talk. How to take your blood pressure To check your blood pressure, follow the instructions in the manual that came with your blood pressure monitor. If you have a digital blood pressure monitor, the instructions may be as follows: Sit up straight in a chair. Place your feet on the floor. Do not cross your ankles or legs. Rest your left arm at the level of your heart on a table or desk or on the arm of a chair. Pull up your shirt sleeve. Wrap the blood pressure cuff around the upper part of your left arm, 1 inch (2.5 cm) above your elbow. It is best to wrap the cuff around bare skin. Fit the cuff snugly, but not too tightly, around your arm. You should be able to place only one finger between the cuff and your arm. Position the cord so that it rests in the bend of your elbow. Press the power button. Sit quietly while the cuff inflates and deflates. Read the digital reading on the monitor screen and write the  numbers down (record them) in a notebook. Wait 2-3 minutes, then repeat the steps, starting at step 1. What does my blood pressure reading mean? A blood pressure reading consists of a higher number over a lower number. Ideally, your blood pressure should be below 120/80. The first ("top") number is called the systolic pressure. It is a measure of the pressure in your arteries as your heart beats. The second ("bottom") number is called the diastolic pressure. It is a measure of the pressure in your arteries as the heart relaxes. Blood pressure is classified  into four stages. The following are the stages for adults who do not have a short-term serious illness or a chronic condition. Systolic pressure and diastolic pressure are measured in a unit called mm Hg (millimeters of mercury).  Normal Systolic pressure: below 120. Diastolic pressure: below 80. Elevated Systolic pressure: 120-129. Diastolic pressure: below 80. Hypertension stage 1 Systolic pressure: 130-139. Diastolic pressure: 80-89. Hypertension stage 2 Systolic pressure: 140 or above. Diastolic pressure: 90 or above. You can have elevated blood pressure or hypertension even if only the systolic or only the diastolic number in your reading is higher than normal. Follow these instructions at home: Medicines Take over-the-counter and prescription medicines only as told by your health care provider. Tell your health care provider if you are having any side effects from blood pressure medicine. General instructions Check your blood pressure as often as recommended by your health care provider. Check your blood pressure at the same time every day. Take your monitor to the next appointment with your health care provider to make sure that: You are using it correctly. It provides accurate readings. Understand what your goal blood pressure numbers are. Keep all follow-up visits. This is important. General tips Your health care provider can suggest a reliable monitor that will meet your needs. There are several types of home blood pressure monitors. Choose a monitor that has an arm cuff. Do not choose a monitor that measures your blood pressure from your wrist or finger. Choose a cuff that wraps snugly, not too tight or too loose, around your upper arm. You should be able to fit only one finger between your arm and the cuff. You can buy a blood pressure monitor at most drugstores or online. Where to find more information American Heart Association: www.heart.org Contact a health care  provider if: Your blood pressure is consistently high. Your blood pressure is suddenly low. Get help right away if: Your systolic blood pressure is higher than 180. Your diastolic blood pressure is higher than 120. These symptoms may be an emergency. Get help right away. Call 911. Do not wait to see if the symptoms will go away. Do not drive yourself to the hospital. Summary Blood pressure is a measurement of how strongly your blood is pressing against the walls of your arteries. A blood pressure reading consists of a higher number over a lower number. Ideally, your blood pressure should be below 120/80. Check your blood pressure at the same time every day. Avoid caffeine, alcohol, smoking, and exercise for 30 minutes prior to checking your blood pressure. These agents can affect the accuracy of the blood pressure reading. This information is not intended to replace advice given to you by your health care provider. Make sure you discuss any questions you have with your health care provider. Document Revised: 03/25/2021 Document Reviewed: 03/25/2021 Elsevier Patient Education  2024 [employer].   .

## 2023-04-19 NOTE — Progress Notes (Signed)
 Per patient no change in vitals since last visit, unable to obtain new vitals due to telehealth visit

## 2023-04-19 NOTE — Progress Notes (Signed)
Virtual Visit via Video Note chart review for visit.  I connected with Erik Velasquez on 04/19/23 at 5:08 PM by a video enabled telemedicine application and verified that I am speaking with the correct person using two identifiers.  Patient location: in car at work, by self.  My location: office [place].    I discussed the limitations, risks, security and privacy concerns of performing an evaluation and management service by telephone and the availability of in person appointments. I also discussed with the patient that there may be a patient responsible charge related to this service. The patient expressed understanding and agreed to proceed, consent obtained  Chief complaint:  Chief Complaint  Patient presents with   Medical Management of Chronic Issues    History of Present Illness: Erik Velasquez is a 42 y.o. male Chronic med follow-up.  Last visit with me was in October 2022, but he did have a visit with my colleague in May 2023.  Blood pressure 157/103 at that time. Erik Velasquez  He was started on medications for anxiety as well as for epigastric pain, labs and CT ordered, 4 to 6-week follow-up recommended.  Generalized anxiety disorder Last discussed in May 2023 with Erik Agee, NP.  At that time was self treating with alcohol, sertraline had subtherapeutic effect.  Had been seen in the ER May 11 with Ativan detox protocol.  Fluoxetine 20 mg and trazodone 50 mg were ordered at that visit with planned follow-up.  Doing much better. No alcohol since 12/02/2021.   Wife and son gave ultimatum, and has been able to remain sober. AA meetings for a few months and met with someone at church for awhile. Coping techniques have been doing well. No relapse.  Feels like alcohol was treating anxiety in past. But doing better now. Melatonin has helped at night at times. Occasionally feel anxious, now 1-2 times per week. Prior daily.  Trazadone as needed - infrequent use at  night.  Fluoexetine daily  - no side effects, no sexual side effects. Feels like he is in a good place for now. No med changes.     GERD: Treated with Protonix 40 mg daily.  Visit with gastroenterology noted from Dec 15, 2021.  Upper endoscopy recommended at that time.  Continued on Protonix with Pepcid as needed.  Also discussed alcohol use disorder and alcohol induced fatty liver disease with abstinence recommended.  AST and ALT had increased to 203, 174 during ER visit May 11.  Had been abstinent for several months and then started again prior to his May 2023 visits.   Repeat LFTs on May 15 had improved to AST 68, ALT 101. Protonix daily - better controlled on meds, Some cough after eating, heartburn returns.  Lab Results  Component Value Date   ALT 101 (H) 12/06/2021   AST 68 (H) 12/06/2021   ALKPHOS 66 12/06/2021   BILITOT 1.1 12/06/2021   Hypertension: Lisinopril 20 mg daily.  Last labs in May of last year.  Last seen as above in 2023. Active job with physical labor. No side effects with meds.  Home readings: none.  BP Readings from Last 3 Encounters:  12/15/21 140/78  12/06/21 (!) 157/103  12/03/21 (!) 138/94   Lab Results  Component Value Date   CREATININE 1.06 12/06/2021  Weight down to 235 now.   Wt Readings from Last 3 Encounters:  04/19/23 235 lb (106.6 kg)  12/15/21 252 lb 6.4 oz (114.5 kg)  12/06/21 239 lb (  108.4 kg)        Patient Active Problem List   Diagnosis Date Noted   GAD (generalized anxiety disorder) 12/06/2021   Elevated liver enzymes 12/06/2021   Epigastric pain 12/06/2021   Alcohol abuse with withdrawal (HCC) 12/02/2021   Alcohol use disorder, severe, dependence (HCC)    Alcohol-induced mood disorder (HCC)    Visit for preventive health examination 11/17/2017   Hypertension 11/15/2016   S/P cholecystectomy 11/15/2016   Past Medical History:  Diagnosis Date   Anxiety    History of chickenpox    Hypertension    Past Surgical History:   Procedure Laterality Date   CHOLECYSTECTOMY     ORTHOPEDIC SURGERY  2012   broken leg   No Known Allergies Prior to Admission medications   Medication Sig Start Date End Date Taking? Authorizing Provider  famotidine (PEPCID) 20 MG tablet Take 1 tablet (20 mg total) by mouth 2 (two) times daily. 12/06/21  Yes Erik Agee, NP  FLUoxetine (PROZAC) 20 MG capsule Take 1 capsule (20 mg total) by mouth daily. 03/17/23  Yes Erik Flood, MD  lisinopril (ZESTRIL) 20 MG tablet TAKE 1 TABLET(20 MG) BY MOUTH DAILY 03/13/23  Yes Erik Flood, MD  LORazepam (ATIVAN) 1 MG tablet Take 1 tablet (1 mg total) by mouth as directed for 8 doses. Ativan taper 5/12 1mg  at 6 PM followed by 1 mg at 10 PM 5/13 1 mg at 10 AM followed by 1 mg at 4 PM followed by 1 mg at 10 PM 5/14 1 mg at 10 AM followed by 1 mg at 10 PM 5/15  1 mg at 10 AM 12/03/21  Yes Estella Husk, MD  pantoprazole (PROTONIX) 40 MG tablet TAKE 1 TABLET(40 MG) BY MOUTH DAILY 11/08/22  Yes Erik Flood, MD  traZODone (DESYREL) 50 MG tablet Take 0.5-1 tablets (25-50 mg total) by mouth at bedtime as needed for sleep. 03/17/23  Yes Erik Flood, MD   Social History   Socioeconomic History   Marital status: Married    Spouse name: Not on file   Number of children: 1   Years of education: Not on file   Highest education level: Not on file  Occupational History   Not on file  Tobacco Use   Smoking status: Every Day    Current packs/day: 0.50    Average packs/day: 0.5 packs/day for 12.0 years (6.0 ttl pk-yrs)    Types: Cigarettes   Smokeless tobacco: Never  Vaping Use   Vaping status: Never Used  Substance and Sexual Activity   Alcohol use: Yes    Comment: 2 drinks (beer) per week   Drug use: No   Sexual activity: Yes  Other Topics Concern   Not on file  Social History Narrative   Not on file   Social Determinants of Health   Financial Resource Strain: Not on file  Food Insecurity: Not on file   Transportation Needs: Not on file  Physical Activity: Not on file  Stress: Not on file  Social Connections: Unknown (11/26/2021)   Received from [facility]   Social Network    Social Network: Not on file  Intimate Partner Violence: Unknown (10/25/2021)   Received from [facility], [facility]   HITS    Physically Hurt: Not on file    Insult or Talk Down To: Not on file    Threaten Physical Harm: Not on file    Scream or Curse: Not on file  Observations/Objective: Vitals:   04/19/23 1626  Weight: 235 lb (106.6 kg)  Height: 6' (1.829 m)  Euthymic mood, nontoxic appearance on video.  Speaking full sentences without respiratory distress.  Does not appear to be responding to internal stimuli.  All questions answered with understanding of plan expressed.   Assessment and Plan: GAD (generalized anxiety disorder) - Plan: FLUoxetine (PROZAC) 20 MG capsule, traZODone (DESYREL) 50 MG tablet Alcohol abuse, in remission  -Much improved, alcohol abuse in remission, sober since May 2023.  Commended on his positive health changes and continued efforts in doing so.  Overall anxiety improved, option of higher dose of fluoxetine if more frequent anxiety flares but he would like to remain on same dose for now.  I think that is reasonable with RTC precautions.  Has coping techniques in place if anxiety flares and advised to let me know if symptoms worsen or any increased cravings for alcohol or relapse.  74-month follow-up.  Essential hypertension - Plan: lisinopril (ZESTRIL) 20 MG tablet, Comprehensive metabolic panel  -Weight is improved with above, unknown home readings.  Advised to check blood pressures at home with handout on after visit summary on appropriate monitoring.  Advised to send me some home readings to decide on medication changes but will continue lisinopril for now, labs if below and within the next 1 week.  40-month follow-up if stable.  Gastroesophageal reflux  disease without esophagitis - Plan: pantoprazole (PROTONIX) 40 MG tablet Elevated liver enzymes - Plan: Comprehensive metabolic panel  -Anticipate improved LFTs off alcohol, labs pending as above.  Persistent reflux, daily need for PPI with intermittent breakthrough symptoms as above off meds.  Previous recommendation for endoscopy, recommended he contact gastroenterology to still see if that is recommended.  RTC precautions.   Follow Up Instructions: 29-month follow-up for physical.  Labs in the next 1 week.   Patient Instructions  Good talking with you today.  Congratulations on the improved health, happy to hear that things are going so well!  Okay to remain on same dose of fluoxetine for now, but as we discussed if you do have some breakthrough anxiety, or more frequent anxiety flares I think it would be reasonable to try the 40 mg dose.  Let me know if you want to make that change.  Keep a record of your blood pressures outside of the office and send me a few readings through MyChart.  See information below on how to most effectively check the blood pressure.  Okay to stay on Protonix daily with Pepcid as needed, but I do recommend contacting gastroenterology regarding endoscopy as that was recommended previously and they may still want to proceed with that study.  Dr. Tiajuana Amass [phone]  Please have lab work performed at the [facility] location within the next 1 week.  If any concerns on labs I will let you know.  As long as blood pressures are stable and labs look okay, follow-up with me in office for physical in the next 3 months.  Please let me know if there are questions and take care.  [facility] or xray: Walk in 8:30-4:30 during weekdays, no appointment needed [address]  [place], [place] [address]  How to Take Your Blood Pressure Blood pressure is a measurement of how strongly your blood is pressing against the walls of your arteries. Arteries are blood vessels  that carry blood from your heart throughout your body. Your health care provider takes your blood pressure at each office visit.  You can also take your own blood pressure at home with a blood pressure monitor. You may need to take your own blood pressure to: Confirm a diagnosis of high blood pressure (hypertension). Monitor your blood pressure over time. Make sure your blood pressure medicine is working. Supplies needed: Blood pressure monitor. A chair to sit in. This should be a chair where you can sit upright with your back supported. Do not sit on a soft couch or an armchair. Table or desk. Small notebook and pencil or pen. How to prepare To get the most accurate reading, avoid the following for 30 minutes before you check your blood pressure: Drinking caffeine. Drinking alcohol. Eating. Smoking. Exercising. Five minutes before you check your blood pressure: Use the bathroom and urinate so that you have an empty bladder. Sit quietly in a chair. Do not talk. How to take your blood pressure To check your blood pressure, follow the instructions in the manual that came with your blood pressure monitor. If you have a digital blood pressure monitor, the instructions may be as follows: Sit up straight in a chair. Place your feet on the floor. Do not cross your ankles or legs. Rest your left arm at the level of your heart on a table or desk or on the arm of a chair. Pull up your shirt sleeve. Wrap the blood pressure cuff around the upper part of your left arm, 1 inch (2.5 cm) above your elbow. It is best to wrap the cuff around bare skin. Fit the cuff snugly, but not too tightly, around your arm. You should be able to place only one finger between the cuff and your arm. Position the cord so that it rests in the bend of your elbow. Press the power button. Sit quietly while the cuff inflates and deflates. Read the digital reading on the monitor screen and write the numbers down (record them)  in a notebook. Wait 2-3 minutes, then repeat the steps, starting at step 1. What does my blood pressure reading mean? A blood pressure reading consists of a higher number over a lower number. Ideally, your blood pressure should be below 120/80. The first ("top") number is called the systolic pressure. It is a measure of the pressure in your arteries as your heart beats. The second ("bottom") number is called the diastolic pressure. It is a measure of the pressure in your arteries as the heart relaxes. Blood pressure is classified into four stages. The following are the stages for adults who do not have a short-term serious illness or a chronic condition. Systolic pressure and diastolic pressure are measured in a unit called mm Hg (millimeters of mercury).  Normal Systolic pressure: below 120. Diastolic pressure: below 80. Elevated Systolic pressure: 120-129. Diastolic pressure: below 80. Hypertension stage 1 Systolic pressure: 130-139. Diastolic pressure: 80-89. Hypertension stage 2 Systolic pressure: 140 or above. Diastolic pressure: 90 or above. You can have elevated blood pressure or hypertension even if only the systolic or only the diastolic number in your reading is higher than normal. Follow these instructions at home: Medicines Take over-the-counter and prescription medicines only as told by your health care provider. Tell your health care provider if you are having any side effects from blood pressure medicine. General instructions Check your blood pressure as often as recommended by your health care provider. Check your blood pressure at the same time every day. Take your monitor to the next appointment with your health care provider to make sure that: You are  using it correctly. It provides accurate readings. Understand what your goal blood pressure numbers are. Keep all follow-up visits. This is important. General tips Your health care provider can suggest a reliable  monitor that will meet your needs. There are several types of home blood pressure monitors. Choose a monitor that has an arm cuff. Do not choose a monitor that measures your blood pressure from your wrist or finger. Choose a cuff that wraps snugly, not too tight or too loose, around your upper arm. You should be able to fit only one finger between your arm and the cuff. You can buy a blood pressure monitor at most drugstores or online. Where to find more information American Heart Association: www.heart.org Contact a health care provider if: Your blood pressure is consistently high. Your blood pressure is suddenly low. Get help right away if: Your systolic blood pressure is higher than 180. Your diastolic blood pressure is higher than 120. These symptoms may be an emergency. Get help right away. Call 911. Do not wait to see if the symptoms will go away. Do not drive yourself to the hospital. Summary Blood pressure is a measurement of how strongly your blood is pressing against the walls of your arteries. A blood pressure reading consists of a higher number over a lower number. Ideally, your blood pressure should be below 120/80. Check your blood pressure at the same time every day. Avoid caffeine, alcohol, smoking, and exercise for 30 minutes prior to checking your blood pressure. These agents can affect the accuracy of the blood pressure reading. This information is not intended to replace advice given to you by your health care provider. Make sure you discuss any questions you have with your health care provider. Document Revised: 03/25/2021 Document Reviewed: 03/25/2021 Elsevier Patient Education  2024 Elsevier Inc.   .     I discussed the assessment and treatment plan with the patient. The patient was provided an opportunity to ask questions and all were answered. The patient agreed with the plan and demonstrated an understanding of the instructions.   The patient was advised to  call back or seek an in-person evaluation if the symptoms worsen or if the condition fails to improve as anticipated.   Erik Flood, MD

## 2023-05-05 ENCOUNTER — Telehealth: Payer: Self-pay | Admitting: Family Medicine

## 2023-05-05 DIAGNOSIS — F411 Generalized anxiety disorder: Secondary | ICD-10-CM

## 2023-05-05 NOTE — Telephone Encounter (Signed)
Caller name: Hayven Fatima   On DPR?: Yes  Call back number: [phone]   Provider they see: Shade Flood, MD  Reason for call:   He decided and they talked that he would to go ahead an increase the dose of anxiety medication FLUoxetine (PROZAC) 20 MG capsule as per discussed at MyChart visit 04/19/23. -advise.

## 2023-05-05 NOTE — Telephone Encounter (Signed)
Pt informed this was already sent on 04/19/23 and he may just have to ask they fill it again if it was already put back

## 2023-05-30 MED ORDER — FLUOXETINE HCL 40 MG PO CAPS: 40.0000 mg | ORAL_CAPSULE | Freq: Every day | ORAL | 3 refills | Status: DC

## 2023-05-30 NOTE — Addendum Note (Signed)
Addended by: Eldred Manges on: 05/30/2023 10:36 AM   Modules accepted: Orders

## 2023-05-30 NOTE — Telephone Encounter (Signed)
Spouse called stating med was never put in as an increase.  FLUoxetine (PROZAC) 20 MG capsule as per discussed at MyChart visit 04/19/23  Pt has been taking 2 pills to date and is about to run out. -Advise

## 2023-05-30 NOTE — Telephone Encounter (Signed)
Changed Rx and informed patient

## 2023-06-12 IMAGING — US US ABDOMEN LIMITED
1 series · 14 of 25 positions shown · non-contrast
Comparison: CT 09/16/2008 report

CLINICAL DATA: Elevated liver enzymes

EXAM:
ULTRASOUND ABDOMEN LIMITED RIGHT UPPER QUADRANT

[Series 1: us abdomen limited · 14 of 37 slices shown]
[im 1/37]
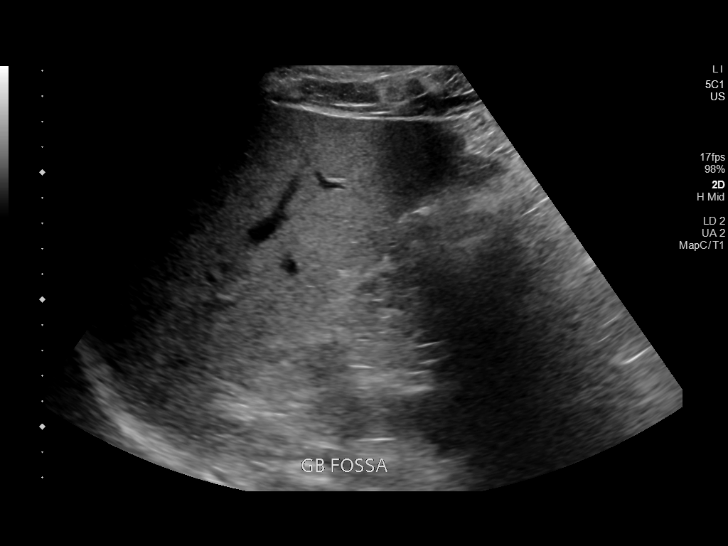
[im 4/37]
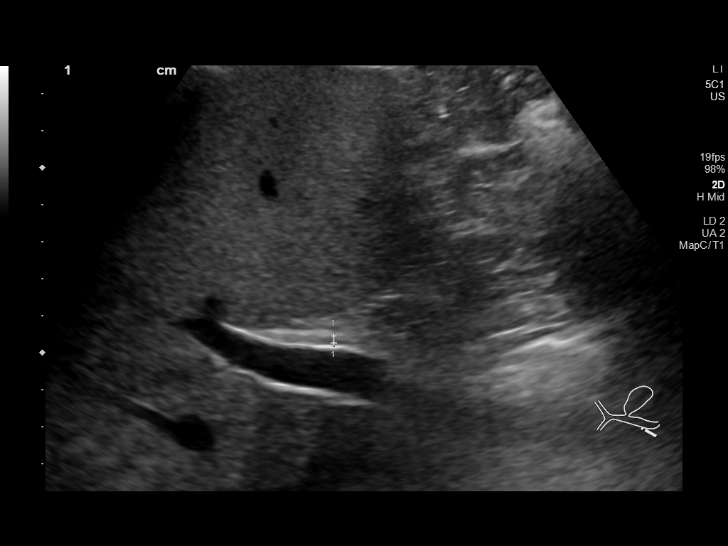
[im 7/37]
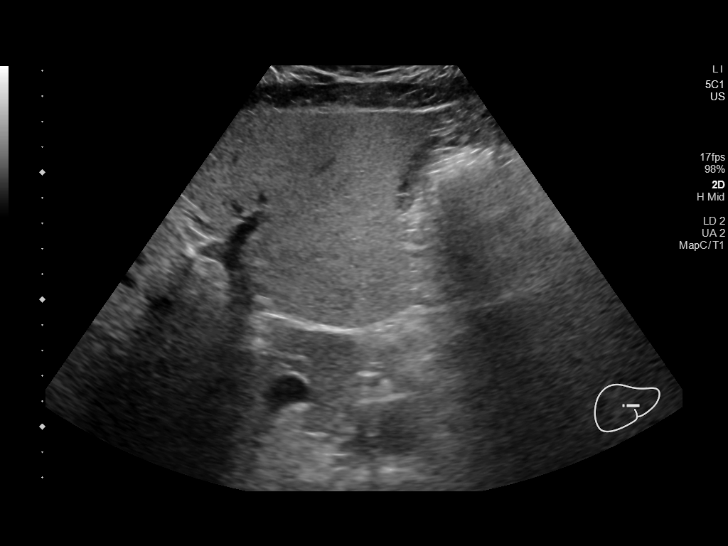
[im 10/37]
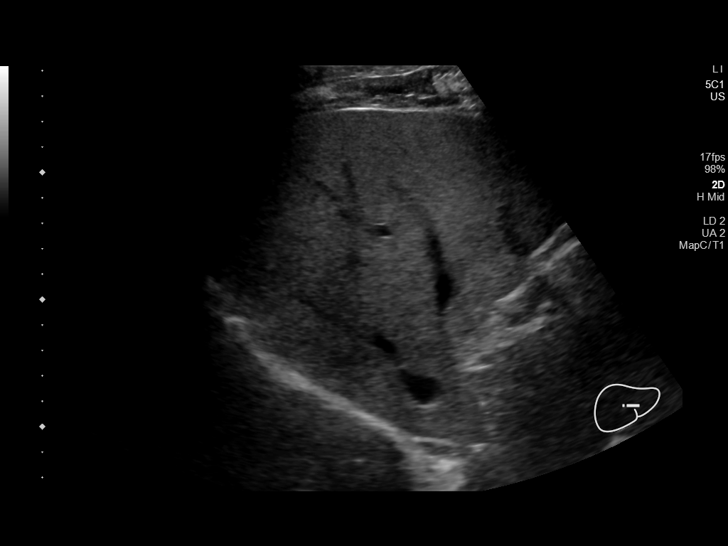
[im 13/37]
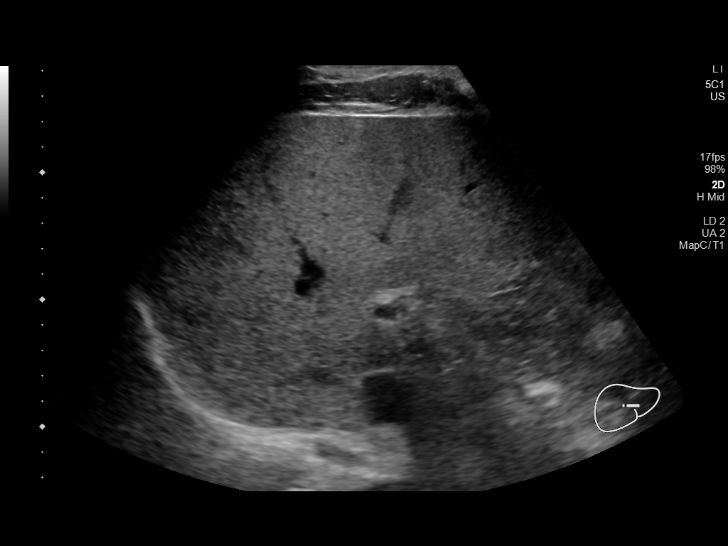
[im 14/37]
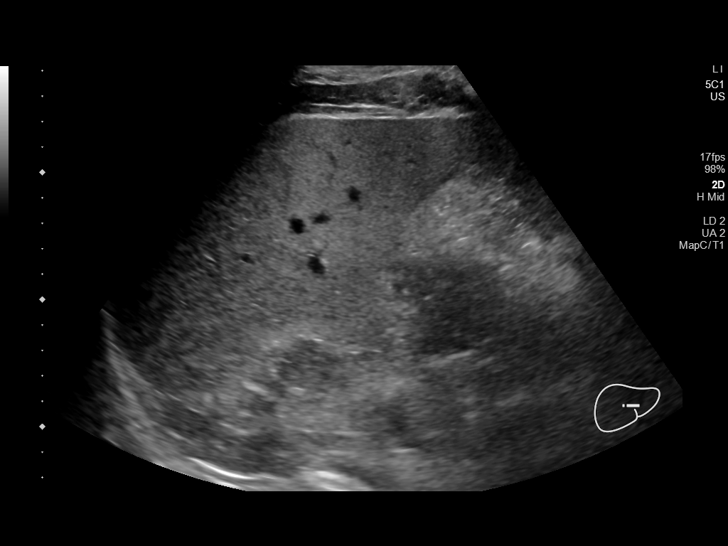
[im 17/37]
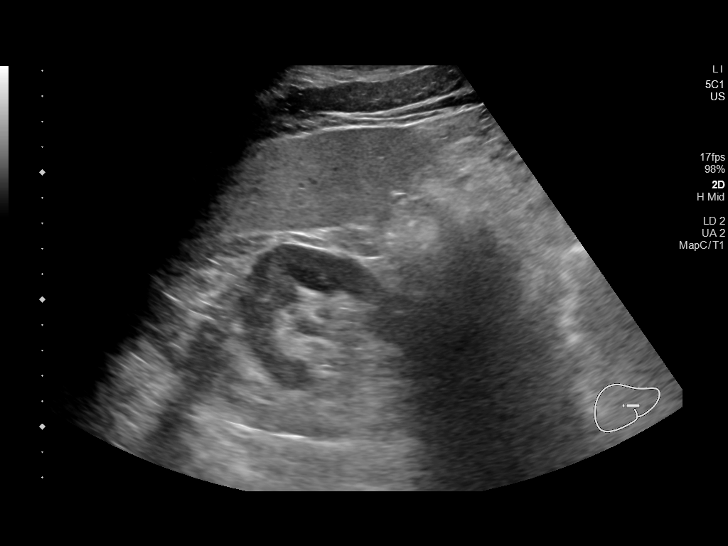
[im 20/37]
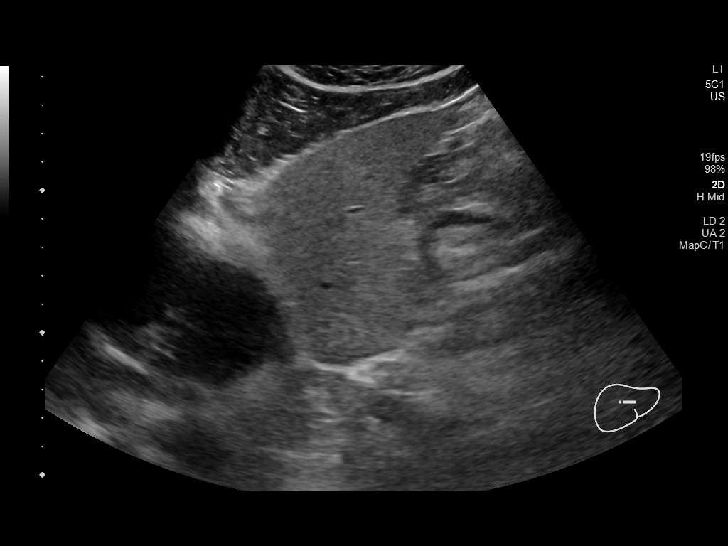
[im 23/37]
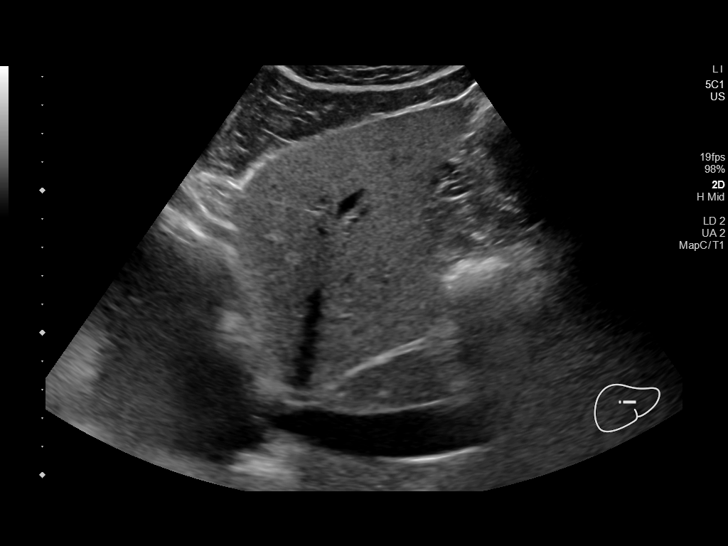
[im 25/37]
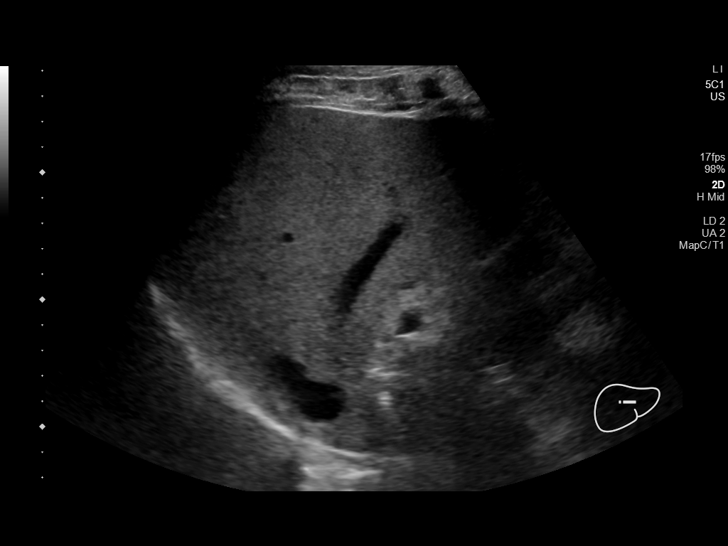
[im 28/37]
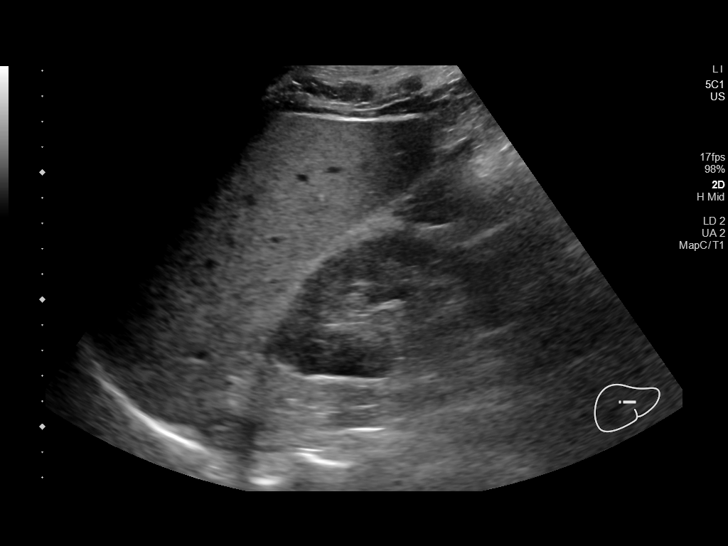
[im 31/37]
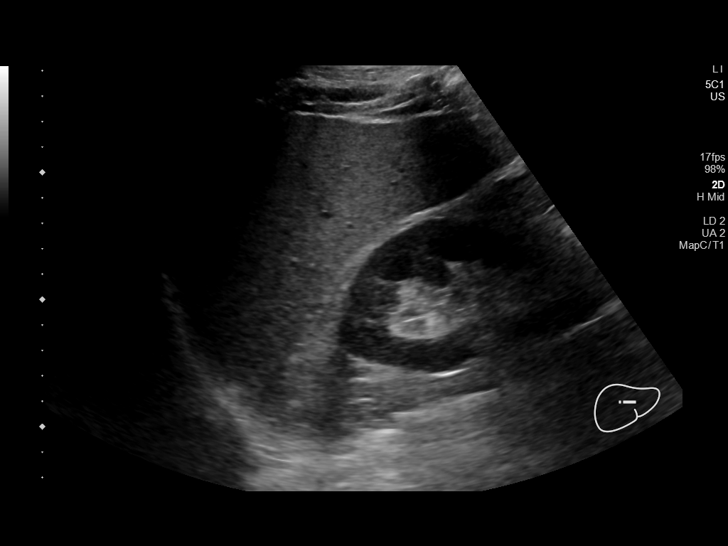
[im 34/37]
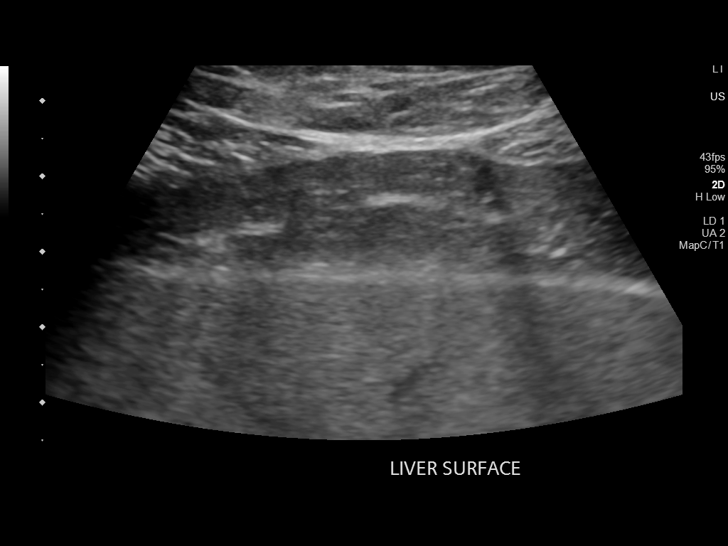
[im 37/37]
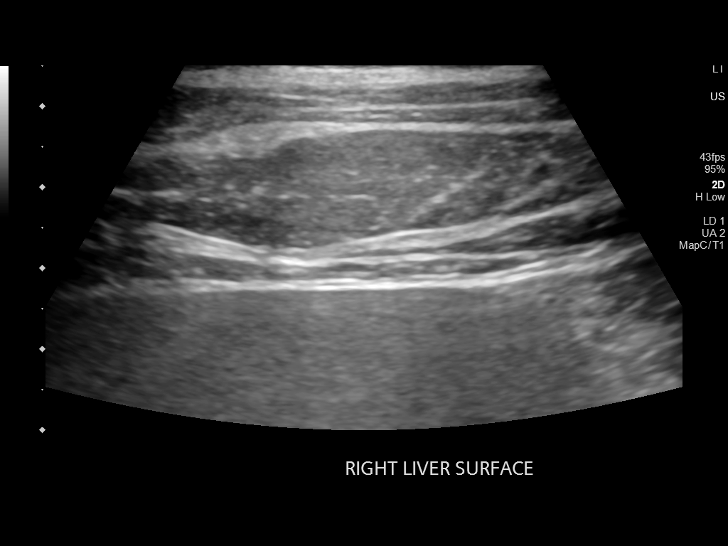

[14 of 25 positions shown; findings below may reference images not displayed]

FINDINGS: Gallbladder:

Status post cholecystectomy

Common bile duct:

Diameter: 1.9 mm

Liver:

Diffusely echogenic. No focal hepatic mass. Portal vein is patent on
color Doppler imaging with normal direction of blood flow towards
the liver.

Other: None.
IMPRESSION: 1. Diffusely echogenic liver consistent with hepatic steatosis and
or hepatocellular disease.
2. Status post cholecystectomy.

## 2023-07-24 ENCOUNTER — Encounter: Payer: BC Managed Care – PPO | Admitting: Family Medicine

## 2023-12-07 ENCOUNTER — Other Ambulatory Visit: Payer: Self-pay | Admitting: Family Medicine

## 2023-12-07 DIAGNOSIS — K219 Gastro-esophageal reflux disease without esophagitis: Secondary | ICD-10-CM

## 2024-03-04 ENCOUNTER — Ambulatory Visit: Admitting: Physician Assistant

## 2024-03-04 ENCOUNTER — Encounter: Payer: Self-pay | Admitting: Physician Assistant

## 2024-03-04 VITALS — BP 140/102 | HR 84 | Ht 70.5 in | Wt 245.5 lb

## 2024-03-04 DIAGNOSIS — K219 Gastro-esophageal reflux disease without esophagitis: Secondary | ICD-10-CM

## 2024-03-04 DIAGNOSIS — R1310 Dysphagia, unspecified: Secondary | ICD-10-CM | POA: Diagnosis not present

## 2024-03-04 DIAGNOSIS — R195 Other fecal abnormalities: Secondary | ICD-10-CM

## 2024-03-04 DIAGNOSIS — R112 Nausea with vomiting, unspecified: Secondary | ICD-10-CM | POA: Diagnosis not present

## 2024-03-04 DIAGNOSIS — K625 Hemorrhage of anus and rectum: Secondary | ICD-10-CM

## 2024-03-04 DIAGNOSIS — R748 Abnormal levels of other serum enzymes: Secondary | ICD-10-CM

## 2024-03-04 DIAGNOSIS — F1020 Alcohol dependence, uncomplicated: Secondary | ICD-10-CM

## 2024-03-04 DIAGNOSIS — R7401 Elevation of levels of liver transaminase levels: Secondary | ICD-10-CM

## 2024-03-04 MED ORDER — NA SULFATE-K SULFATE-MG SULF 17.5-3.13-1.6 GM/177ML PO SOLN: 1.0000 | Freq: Once | ORAL | 0 refills | Status: AC

## 2024-03-04 MED ORDER — PANTOPRAZOLE SODIUM 40 MG PO TBEC: 40.0000 mg | DELAYED_RELEASE_TABLET | Freq: Two times a day (BID) | ORAL | 3 refills | Status: AC

## 2024-03-04 NOTE — Progress Notes (Signed)
 Erik Console, PA-C [address] [place], [place]  [address] Phone: [phone]   Primary Care Physician: Erik Reyes SAUNDERS, MD  Primary Gastroenterologist:  Erik Console, PA-C / Glendia Holt, MD   Chief Complaint:  GERD, Blood In Stool       HPI:   Erik Velasquez is a 43 y.o. male presents to evaluate blood in stool and followup GERD.  Currently taking Pantoprazole  40mg  daily and Famotidine  20mg  BID.  This is not controlling his acid reflux.  He reports breakthrough heartburn at night and in the morning.  He admits to drinking 5-7 white claws alcoholic beverages daily.  He is trying to stop drinking alcohol .  Currently seeking help through his PCP and counselor.  Has not attended AA.  He admits to occasional solid food dysphagia.  He feels like food gets stuck in his mid upper chest.  Has episodes of nausea and vomiting.  He denies upper abdominal pain.  Underwent cholecystectomy 13 years ago.  He reports episodes of bright red rectal bleeding for 4 or 5 months.  It occurs once or twice per week.  He notes bright red blood on the tissue after bowel movements.  Typically has 2 or 3 loose stools per day.  He denies constipation, hemorrhoids or weight loss.  No family history of colon cancer.  Last seen in our office by Dr. Holt 11/2021 for f/u GERD, Nausea and Vomiting.  History of alcohol  dependence.  No previous EGD or Colonoscopy.  11/2021: H. Pylori Antibody, IgG: Negative.  04/2021 RUQ US :  1. Diffusely echogenic liver consistent with hepatic steatosis and / or hepatocellular disease. 2. Status post cholecystectomy.  No repeat labs since 11/2021.  Hx Elevated Liver transaminases: Component Ref Range & Units (hover) 2 yr ago (12/06/21) 2 yr ago (12/02/21) 2 yr ago (05/17/21) 2 yr ago (05/03/21) 6 yr ago (11/17/17) 15 yr ago (09/25/08) 15 yr ago (09/20/08)  Sodium 140 141 R 140 143 141 141 140  Potassium 4.1 3.6 R 4.4 4.4 4.4 4.6 3.5  Chloride 103 103 R  105 105 104 101 105  CO2 25 21 Low  R 27 26 25 26 29   Glucose, Bld 97 106 High  CM 87 91 92 108 High  118 High   BUN 11 6 R 12 8 14 9 3  Low   Creatinine, Ser 1.06 0.97 R 1.15 0.99 1.09 1.0 R 1.26 R  Total Bilirubin 1.1 0.5 R 0.5 0.6 0.5 1.4 High  R 1.4 High  R  Alkaline Phosphatase 66 69 R 63 64 68 138 High  94  AST 68 High  203 High  R 22 159 High  21 102 High  325 High   ALT 101 High  174 High  R 41 148 High  37 507 High  1,159 High     Component Ref Range & Units (hover) 2 yr ago (12/06/21) 2 yr ago (12/02/21) 2 yr ago (05/03/21) 6 yr ago (11/17/17) 15 yr ago (09/25/08) 15 yr ago (09/25/08) 15 yr ago (09/20/08)  WBC 9.1 10.2 9.4 6.5  11.6 High  7.3  RBC 4.22 4.58 R 4.07 Low  4.48  4.87 R 4.14 Low  R  Hemoglobin 14.5 15.8 13.9 14.5  14.8 12.9 Low   HCT 42.7 44.1 41.7 42.5  44.8 38.7 Low   MCV 101.4 High  96.3 R 102.4 High  94.9  92.0 R 93.6 R  MCHC 34.0 35.8 33.3 34.0  33.0 33.3  RDW 13.0 12.0 13.0 13.0  12.1 12.7  Platelets 225.0 293 R 295.0 350.0  325 R 226   Current Outpatient Medications  Medication Sig Dispense Refill   FLUoxetine  (PROZAC ) 20 MG capsule Take 20 mg by mouth daily.     LORazepam  (ATIVAN ) 0.5 MG tablet Take 0.5 mg by mouth as needed.     Na Sulfate-K Sulfate-Mg Sulfate concentrate (SUPREP) 17.5-3.13-1.6 GM/177ML SOLN Take 1 kit (354 mLs total) by mouth once for 1 dose. 354 mL 0   famotidine  (PEPCID ) 20 MG tablet Take 1 tablet (20 mg total) by mouth 2 (two) times daily. 180 tablet 0   lisinopril  (ZESTRIL ) 20 MG tablet Take 1 tablet (20 mg total) by mouth daily. 90 tablet 1   pantoprazole  (PROTONIX ) 40 MG tablet Take 1 tablet (40 mg total) by mouth 2 (two) times daily. TAKE 1 TABLET(40 MG) BY MOUTH DAILY 60 tablet 3   traZODone  (DESYREL ) 50 MG tablet Take 0.5-1 tablets (25-50 mg total) by mouth at bedtime as needed for sleep. 30 tablet 5   No current facility-administered medications for this visit.    Allergies as of 03/04/2024   (No Known Allergies)    Past  Medical History:  Diagnosis Date   Anxiety    History of chickenpox    Hypertension     Past Surgical History:  Procedure Laterality Date   CHOLECYSTECTOMY  2012   ORTHOPEDIC SURGERY  2012   broken leg    Review of Systems:    All systems reviewed and negative except where noted in HPI.    Physical Exam:  BP (!) 140/102   Pulse 84   Ht 5' 10.5 (1.791 m) Comment: height measured without shoes  Wt 245 lb 8 oz (111.4 kg)   BMI 34.73 kg/m  No LMP for male patient.  General: Well-nourished, well-developed in no acute distress.  Lungs: Clear to auscultation bilaterally. Non-labored. Heart: Regular rate and rhythm, no murmurs rubs or gallops.  Abdomen: Bowel sounds are normal; Abdomen is Soft; No hepatosplenomegaly, masses or hernias;  No Abdominal Tenderness; No guarding or rebound tenderness. Neuro: Alert and oriented x 3.  Grossly intact.  Psych: Alert and cooperative, normal mood and affect.   Imaging Studies: No results found.  Labs: CBC    Component Value Date/Time   WBC 9.1 12/06/2021 0835   RBC 4.22 12/06/2021 0835   HGB 14.5 12/06/2021 0835   HCT 42.7 12/06/2021 0835   PLT 225.0 12/06/2021 0835   MCV 101.4 (H) 12/06/2021 0835   MCH 34.5 (H) 12/02/2021 1650   MCHC 34.0 12/06/2021 0835   RDW 13.0 12/06/2021 0835   LYMPHSABS 1.5 12/06/2021 0835   MONOABS 0.6 12/06/2021 0835   EOSABS 0.1 12/06/2021 0835   BASOSABS 0.0 12/06/2021 0835    CMP     Component Value Date/Time   NA 140 12/06/2021 0835   K 4.1 12/06/2021 0835   CL 103 12/06/2021 0835   CO2 25 12/06/2021 0835   GLUCOSE 97 12/06/2021 0835   BUN 11 12/06/2021 0835   CREATININE 1.06 12/06/2021 0835   CALCIUM 10.0 12/06/2021 0835   PROT 6.8 12/06/2021 0835   ALBUMIN 4.4 12/06/2021 0835   AST 68 (H) 12/06/2021 0835   ALT 101 (H) 12/06/2021 0835   ALKPHOS 66 12/06/2021 0835   BILITOT 1.1 12/06/2021 0835   GFRNONAA >60 12/02/2021 1650   GFRAA  09/25/2008 2123    >60        The eGFR has been  calculated using the MDRD equation. This calculation has not been validated in all clinical situations. eGFR's persistently <60 mL/min signify possible Chronic Kidney Disease.     Assessment and Plan:   Erik Velasquez is a 43 y.o. y/o male presents for:  1.  GERD, nausea and vomiting, and Dysphagia -  Labs: CBC, CMP, lipase - Increase pantoprazole to 40 Mg twice daily, #60, 3 refills. -Continue famotidine 20 Mg twice daily as needed. - Scheduling EGD I discussed risks of EGD with patient to include risk of bleeding, perforation, and risk of sedation.  Patient expressed understanding and agrees to proceed with EGD.  - Recommend Lifestyle Modifications to prevent Acid Reflux.  Rec. Avoid coffee, sodas, peppermint, garlic, onions, alcohol, citrus fruits, chocolate, tomatoes, fatty and spicey foods.  Avoid eating 2-3 hours before bedtime.    2.  Blood in stool - Labs CBC - Scheduling Colonoscopy I discussed risks of colonoscopy with patient to include risk of bleeding, colon perforation, and risk of sedation.  Patient expressed understanding and agrees to proceed with colonoscopy.   3.  Alcohol dependence - Alcohol cessation resources discussed including Alcoholics Anonymous and Risk manager. - He will also continue to follow-up with PCP and counselor for support.  4.  Elevated liver transaminases - Labs: CMP - Encouraged alcohol cessation.  Discussed risk of developing cirrhosis.  Patient expressed understanding.   Erik Console, PA-C  Follow up 3 months with Dr. Stacia or TG.

## 2024-03-04 NOTE — Patient Instructions (Signed)
 Your provider has requested that you go to the basement level for lab work before leaving today. Press B on the elevator. The lab is located at the first door on the left as you exit the elevator.  We have sent the following medications to your pharmacy for you to pick up at your convenience: Pantoprazole  40 mg twice daily   You have been scheduled for an Endoscopy and Colonoscopy. Please follow the written instructions given to you at your visit today.  If you use inhalers (even only as needed), please bring them with you on the day of your procedure.  DO NOT TAKE 7 DAYS PRIOR TO TEST- Trulicity (dulaglutide) Ozempic, Wegovy (semaglutide) Mounjaro (tirzepatide) Bydureon Bcise (exanatide extended release)  DO NOT TAKE 1 DAY PRIOR TO YOUR TEST Rybelsus (semaglutide) Adlyxin (lixisenatide) Victoza (liraglutide) Byetta (exanatide) ___________________________________________________________________________  Please follow up sooner if symptoms increase or worsen __________________________________________________________________________  Due to recent changes in healthcare laws, you may see the results of your imaging and laboratory studies on MyChart before your provider has had a chance to review them.  We understand that in some cases there may be results that are confusing or concerning to you. Not all laboratory results come back in the same time frame and the provider may be waiting for multiple results in order to interpret others.  Please give us  48 hours in order for your provider to thoroughly review all the results before contacting the office for clarification of your results.   Thank you for trusting me with your gastrointestinal care!   [name], PA-C _______________________________________________________  If your blood pressure at your visit was 140/90 or greater, please contact your primary care physician to follow up on  this.  _______________________________________________________  If you are age 43 or older, your body mass index should be between 23-30. Your Body mass index is 34.73 kg/m. If this is out of the aforementioned range listed, please consider follow up with your Primary Care Provider.  If you are age 37 or younger, your body mass index should be between 19-25. Your Body mass index is 34.73 kg/m. If this is out of the aformentioned range listed, please consider follow up with your Primary Care Provider.   ________________________________________________________  The [facility] providers would like to encourage you to use MYCHART to communicate with providers for non-urgent requests or questions.  Due to long hold times on the telephone, sending your provider a message by [facility] may be a faster and more efficient way to get a response.  Please allow 48 business hours for a response.  Please remember that this is for non-urgent requests.  _______________________________________________________

## 2024-03-12 NOTE — Progress Notes (Signed)
 Agree with the assessment and plan as outlined by Brigitte Canard, PA-C.

## 2024-03-27 ENCOUNTER — Encounter: Payer: Self-pay | Admitting: Gastroenterology

## 2024-04-02 ENCOUNTER — Telehealth: Payer: Self-pay | Admitting: Gastroenterology

## 2024-04-02 NOTE — Telephone Encounter (Signed)
 Inbound call from patients wife stating that patient is scheduled to have a colonoscopy and endoscopy tomorrow at 1:30 with Dr. Stacia. She states that patient started feeling bad yesterday and this morning he had a fever of 102. I advised her that he will more than likely need to reschedule, wife requested I have a nurse give her a call to discuss. Please advise.

## 2024-04-02 NOTE — Telephone Encounter (Signed)
 Returned the patient's phone call. He is experiencing body aches and a fever of 103. As per [facility] policy will reschedule the patient for 05/01/24 at 12:30 pm. Reviewed new prep instruction times with patient.

## 2024-04-03 ENCOUNTER — Encounter: Admitting: Gastroenterology

## 2024-05-01 ENCOUNTER — Encounter: Payer: Self-pay | Admitting: Gastroenterology

## 2024-05-01 ENCOUNTER — Ambulatory Visit (AMBULATORY_SURGERY_CENTER): Admitting: Gastroenterology

## 2024-05-01 VITALS — BP 183/127 | HR 86 | Temp 97.4°F | Resp 17 | Ht 70.5 in | Wt 245.8 lb

## 2024-05-01 DIAGNOSIS — D122 Benign neoplasm of ascending colon: Secondary | ICD-10-CM

## 2024-05-01 DIAGNOSIS — K625 Hemorrhage of anus and rectum: Secondary | ICD-10-CM

## 2024-05-01 DIAGNOSIS — K921 Melena: Secondary | ICD-10-CM | POA: Diagnosis not present

## 2024-05-01 DIAGNOSIS — D124 Benign neoplasm of descending colon: Secondary | ICD-10-CM | POA: Diagnosis not present

## 2024-05-01 DIAGNOSIS — K219 Gastro-esophageal reflux disease without esophagitis: Secondary | ICD-10-CM | POA: Diagnosis not present

## 2024-05-01 DIAGNOSIS — K640 First degree hemorrhoids: Secondary | ICD-10-CM

## 2024-05-01 DIAGNOSIS — K644 Residual hemorrhoidal skin tags: Secondary | ICD-10-CM | POA: Diagnosis not present

## 2024-05-01 DIAGNOSIS — R1310 Dysphagia, unspecified: Secondary | ICD-10-CM

## 2024-05-01 DIAGNOSIS — R748 Abnormal levels of other serum enzymes: Secondary | ICD-10-CM

## 2024-05-01 DIAGNOSIS — F1020 Alcohol dependence, uncomplicated: Secondary | ICD-10-CM

## 2024-05-01 DIAGNOSIS — K635 Polyp of colon: Secondary | ICD-10-CM | POA: Diagnosis not present

## 2024-05-01 MED ORDER — SODIUM CHLORIDE 0.9 % IV SOLN: 500.0000 mL | Freq: Once | INTRAVENOUS | Status: DC

## 2024-05-01 NOTE — Progress Notes (Signed)
 Called to room to assist during endoscopic procedure.  Patient ID and intended procedure confirmed with present staff. Received instructions for my participation in the procedure from the performing physician.

## 2024-05-01 NOTE — Op Note (Signed)
 [facility] Patient Name: Erik Velasquez Procedure Date: 05/01/2024 1:22 PM MRN: [id number] Endoscopist: Glendia E. Stacia , MD, [id number] Age: 43 Referring MD:  Date of Birth: 17-Mar-1981 Gender: Male Account #: [id number] Procedure:                Upper GI endoscopy Indications:              Esophageal reflux symptoms that persist despite                            appropriate therapy Medicines:                Monitored Anesthesia Care Procedure:                Pre-Anesthesia Assessment:                           - Prior to the procedure, a History and Physical                            was performed, and patient medications and                            allergies were reviewed. The patient's tolerance of                            previous anesthesia was also reviewed. The risks                            and benefits of the procedure and the sedation                            options and risks were discussed with the patient.                            All questions were answered, and informed consent                            was obtained. Prior Anticoagulants: The patient has                            taken no anticoagulant or antiplatelet agents. ASA                            Grade Assessment: III - A patient with severe                            systemic disease. After reviewing the risks and                            benefits, the patient was deemed in satisfactory                            condition to undergo the procedure.  After obtaining informed consent, the endoscope was                            passed under direct vision. Throughout the                            procedure, the patient's blood pressure, pulse, and                            oxygen saturations were monitored continuously. The                            Endoscope was introduced through the mouth, and                            advanced to the second  part of duodenum. The upper                            GI endoscopy was accomplished without difficulty.                            The patient tolerated the procedure well. Scope In: Scope Out: Findings:                 The examined esophagus was normal.                           The gastroesophageal flap valve was visualized                            endoscopically and classified as Hill Grade II                            (fold present, opens with respiration).                           The entire examined stomach was normal.                           The examined duodenum was normal. Complications:            No immediate complications. Estimated Blood Loss:     Estimated blood loss: none. Impression:               - Normal esophagus. No evidence of reflux                            esophagitis, stricture or Barrett's esophagus.                           - Gastroesophageal flap valve classified as Hill                            Grade II (fold present, opens with respiration).                           -  Normal stomach.                           - Normal examined duodenum.                           - No specimens collected. Recommendation:           - Patient has a contact number available for                            emergencies. The signs and symptoms of potential                            delayed complications were discussed with the                            patient. Return to normal activities tomorrow.                            Written discharge instructions were provided to the                            patient.                           - Resume previous diet.                           - Continue present medications. Erik Velasquez E. Stacia, MD 05/01/2024 2:04:13 PM This report has been signed electronically.

## 2024-05-01 NOTE — Op Note (Signed)
 [facility] Patient Name: Erik Velasquez Procedure Date: 05/01/2024 1:22 PM MRN: [id number] Endoscopist: Glendia E. Stacia , MD, [id number] Age: 43 Referring MD:  Date of Birth: 1980/11/08 Gender: Male Account #: [id number] Procedure:                Colonoscopy Indications:              Hematochezia Medicines:                Monitored Anesthesia Care Procedure:                Pre-Anesthesia Assessment:                           - Prior to the procedure, a History and Physical                            was performed, and patient medications and                            allergies were reviewed. The patient's tolerance of                            previous anesthesia was also reviewed. The risks                            and benefits of the procedure and the sedation                            options and risks were discussed with the patient.                            All questions were answered, and informed consent                            was obtained. Prior Anticoagulants: The patient has                            taken no anticoagulant or antiplatelet agents. ASA                            Grade Assessment: III - A patient with severe                            systemic disease. After reviewing the risks and                            benefits, the patient was deemed in satisfactory                            condition to undergo the procedure.                           After obtaining informed consent, the colonoscope  was passed under direct vision. Throughout the                            procedure, the patient's blood pressure, pulse, and                            oxygen saturations were monitored continuously. The                            CF [id number] #[id number] was introduced through the anus                            and advanced to the the terminal ileum, with                            identification of the appendiceal  orifice and IC                            valve. The colonoscopy was performed without                            difficulty. The patient tolerated the procedure                            well. The quality of the bowel preparation was                            adequate. The terminal ileum, ileocecal valve,                            appendiceal orifice, and rectum were photographed.                            The bowel preparation used was SUPREP via split                            dose instruction. Scope In: 1:38:36 PM Scope Out: 1:58:07 PM Scope Withdrawal Time: 0 hours 15 minutes 52 seconds  Total Procedure Duration: 0 hours 19 minutes 31 seconds  Findings:                 Hemorrhoids were found on perianal exam.                           The digital rectal exam was normal. Pertinent                            negatives include normal sphincter tone and no                            palpable rectal lesions.                           Three sessile polyps were found in the ascending  colon. The polyps were 3 to 9 mm in size. These                            polyps were removed with a cold snare. Resection                            and retrieval were complete. Estimated blood loss                            was minimal.                           A 12 mm polyp was found in the descending colon.                            The polyp was pedunculated. The polyp was removed                            with a hot snare. Resection and retrieval were                            complete. Estimated blood loss: none.                           The exam was otherwise normal throughout the                            examined colon.                           The terminal ileum appeared normal.                           Internal hemorrhoids were found during                            retroflexion. The hemorrhoids were Grade I                            (internal  hemorrhoids that do not prolapse).                           No additional abnormalities were found on                            retroflexion. Complications:            No immediate complications. Estimated Blood Loss:     Estimated blood loss was minimal. Impression:               - External hemorrhoids found on perianal exam.                           - Three 3 to 9 mm polyps in the ascending colon,  removed with a cold snare. Resected and retrieved.                           - One 12 mm polyp in the descending colon, removed                            with a hot snare. Resected and retrieved.                           - The examined portion of the ileum was normal.                           - Internal hemorrhoids. This is the most likely                            source of the patient's hematochezia. Recommendation:           - Patient has a contact number available for                            emergencies. The signs and symptoms of potential                            delayed complications were discussed with the                            patient. Return to normal activities tomorrow.                            Written discharge instructions were provided to the                            patient.                           - Resume previous diet.                           - Continue present medications.                           - Await pathology results.                           - Repeat colonoscopy (date not yet determined) for                            surveillance based on pathology results. [name] E. Stacia, MD 05/01/2024 2:12:02 PM This report has been signed electronically.

## 2024-05-01 NOTE — Progress Notes (Signed)
 Erik Velasquez   Primary Care Physician:  Erik Reyes SAUNDERS, Erik Velasquez   Reason for Procedure:   Uncontrolled GERD, hematochezia  Plan:    EGD, colonoscopy     HPI: Erik Velasquez is a 43 y.o. male undergoing EGD and colonoscopy to evaluate uncontrolled GERD symptoms and recurrent hematochezia.  GERD symptoms have improved in the past month since his office visit.  His blood pressure is significantly elevated today (190s/110s) because he has not been taking his blood pressure medications. He understands the risk of stroke increases in the setting of sedation with uncontrolled BP and desires to proceed.  Past Medical History:  Diagnosis Date   Anxiety    History of chickenpox    Hypertension     Past Surgical History:  Procedure Laterality Date   CHOLECYSTECTOMY  2012   ORTHOPEDIC SURGERY  2012   broken leg    Prior to Admission medications   Medication Sig Start Date End Date Taking? Authorizing Provider  famotidine  (PEPCID ) 20 MG tablet Take 1 tablet (20 mg total) by mouth 2 (two) times daily. 12/06/21  Yes Erik Ade, Erik Velasquez  FLUoxetine  (PROZAC ) 20 MG capsule Take 20 mg by mouth daily. 02/28/24  Yes Provider, Historical, Erik Velasquez  clonazePAM (KLONOPIN) 0.25 MG disintegrating tablet Take by mouth. 04/01/24   Provider, Historical, Erik Velasquez  lisinopril  (ZESTRIL ) 20 MG tablet Take 1 tablet (20 mg total) by mouth daily. 04/19/23   Erik Reyes SAUNDERS, Erik Velasquez  LORazepam  (ATIVAN ) 0.5 MG tablet Take 0.5 mg by mouth as needed. 02/21/24   Provider, Historical, Erik Velasquez  Na Sulfate-K Sulfate-Mg Sulfate concentrate (SUPREP) 17.5-3.13-1.6 GM/177ML SOLN  04/01/24   Provider, Historical, Erik Velasquez  pantoprazole  (PROTONIX ) 40 MG tablet Take 1 tablet (40 mg total) by mouth 2 (two) times daily. TAKE 1 TABLET(40 MG) BY MOUTH DAILY Patient not taking: Reported on 05/01/2024 03/04/24   [place], Erik Velasquez  traZODone  (DESYREL ) 50 MG tablet Take 0.5-1 tablets (25-50 mg total) by mouth at bedtime as needed  for sleep. 04/19/23   Erik Reyes SAUNDERS, Erik Velasquez    Current Outpatient Medications  Medication Sig Dispense Refill   famotidine  (PEPCID ) 20 MG tablet Take 1 tablet (20 mg total) by mouth 2 (two) times daily. 180 tablet 0   FLUoxetine  (PROZAC ) 20 MG capsule Take 20 mg by mouth daily.     clonazePAM (KLONOPIN) 0.25 MG disintegrating tablet Take by mouth.     lisinopril  (ZESTRIL ) 20 MG tablet Take 1 tablet (20 mg total) by mouth daily. 90 tablet 1   LORazepam  (ATIVAN ) 0.5 MG tablet Take 0.5 mg by mouth as needed.     Na Sulfate-K Sulfate-Mg Sulfate concentrate (SUPREP) 17.5-3.13-1.6 GM/177ML SOLN      pantoprazole  (PROTONIX ) 40 MG tablet Take 1 tablet (40 mg total) by mouth 2 (two) times daily. TAKE 1 TABLET(40 MG) BY MOUTH DAILY (Patient not taking: Reported on 05/01/2024) 60 tablet 3   traZODone  (DESYREL ) 50 MG tablet Take 0.5-1 tablets (25-50 mg total) by mouth at bedtime as needed for sleep. 30 tablet 5   Current Facility-Administered Medications  Medication Dose Route Frequency Provider Last Rate Last Admin   0.9 %  sodium chloride infusion  500 mL Intravenous Once Erik Glendia BRAVO, Erik Velasquez        Allergies as of 05/01/2024   (No Known Allergies)    Family History  Problem Relation Age of Onset   Diabetes Mother    Healthy Father    Healthy Sister    Healthy Maternal  Grandmother    Alzheimer's disease Maternal Grandfather    Healthy Son    Colon cancer Neg Hx    Esophageal cancer Neg Hx    Stomach cancer Neg Hx    Rectal cancer Neg Hx     Social History   Socioeconomic History   Marital status: Married    Spouse name: Not on file   Number of children: 1   Years of education: Not on file   Highest education level: Not on file  Occupational History   Not on file  Tobacco Use   Smoking status: Every Day    Current packs/day: 0.50    Average packs/day: 0.5 packs/day for 12.0 years (6.0 ttl pk-yrs)    Types: Cigarettes   Smokeless tobacco: Never  Vaping Use   Vaping status:  Never Used  Substance and Sexual Activity   Alcohol  use: Yes    Comment: 2 drinks (beer) per week   Drug use: No   Sexual activity: Yes  Other Topics Concern   Not on file  Social History Narrative   Not on file   Social Drivers of Health   Financial Resource Strain: Not on file  Food Insecurity: Not on file  Transportation Needs: Not on file  Velasquez Activity: Not on file  Stress: Not on file  Social Connections: Unknown (11/26/2021)   Received from [facility]   Social Network    Social Network: Not on file  Intimate Partner Violence: Unknown (10/25/2021)   Received from [facility]   HITS    Physically Hurt: Not on file    Insult or Talk Down To: Not on file    Threaten Velasquez Harm: Not on file    Scream or Curse: Not on file    Review of Systems:  All other review of systems negative except as mentioned in the HPI.  Velasquez Exam: Vital signs BP (!) 191/120   Pulse 96   Temp (!) 97.4 F (36.3 C) (Skin)   Ht 5' 10.5 (1.791 m)   Wt 245 lb 12.8 oz (111.5 kg)   SpO2 98%   BMI 34.77 kg/m   General:   Alert,  Well-developed, well-nourished, pleasant and cooperative in NAD Airway:  Mallampati 3 Lungs:  Clear throughout to auscultation.   Heart:  Regular rate and rhythm; no murmurs, clicks, rubs,  or gallops. Abdomen:  Soft, nontender and nondistended. Normal bowel sounds.   Neuro/Psych:  Normal mood and affect. A and O x 3   Erik Menzer E. Stacia, Erik Velasquez [facility]

## 2024-05-01 NOTE — Patient Instructions (Signed)
 Resume all of your previous medications today as ordered. Take your B/P meds every day. Read your discharge instructions.   YOU HAD AN ENDOSCOPIC PROCEDURE TODAY AT THE [facility]:   Refer to the procedure report that was given to you for any specific questions about what was found during the examination.  If the procedure report does not answer your questions, please call your gastroenterologist to clarify.  If you requested that your care partner not be given the details of your procedure findings, then the procedure report has been included in a sealed envelope for you to review at your convenience later.  YOU SHOULD EXPECT: Some feelings of bloating in the abdomen. Passage of more gas than usual.  Walking can help get rid of the air that was put into your GI tract during the procedure and reduce the bloating. If you had a lower endoscopy (such as a colonoscopy or flexible sigmoidoscopy) you may notice spotting of blood in your stool or on the toilet paper. If you underwent a bowel prep for your procedure, you may not have a normal bowel movement for a few days.  Please Note:  You might notice some irritation and congestion in your nose or some drainage.  This is from the oxygen used during your procedure.  There is no need for concern and it should clear up in a day or so.  SYMPTOMS TO REPORT IMMEDIATELY:  Following lower endoscopy (colonoscopy or flexible sigmoidoscopy):  Excessive amounts of blood in the stool  Significant tenderness or worsening of abdominal pains  Swelling of the abdomen that is new, acute  Fever of 100F or higher  Following upper endoscopy (EGD)  Vomiting of blood or coffee ground material  New chest pain or pain under the shoulder blades  Painful or persistently difficult swallowing  New shortness of breath  Fever of 100F or higher  Black, tarry-looking stools  For urgent or emergent issues, a gastroenterologist can be reached at any hour by  calling [phone]. Do not use MyChart messaging for urgent concerns.    DIET:  We do recommend a small meal at first, but then you may proceed to your regular diet.  Drink plenty of fluids but you should avoid alcoholic beverages for 24 hours.  ACTIVITY:  You should plan to take it easy for the rest of today and you should NOT DRIVE or use heavy machinery until tomorrow (because of the sedation medicines used during the test).    FOLLOW UP: Our staff will call the number listed on your records the next business day following your procedure.  We will call around 7:15- 8:00 am to check on you and address any questions or concerns that you may have regarding the information given to you following your procedure. If we do not reach you, we will leave a message.     If any biopsies were taken you will be contacted by phone or by letter within the next 1-3 weeks.  Please call us  at [phone] if you have not heard about the biopsies in 3 weeks.    SIGNATURES/CONFIDENTIALITY: You and/or your care partner have signed paperwork which will be entered into your electronic medical record.  These signatures attest to the fact that that the information above on your After Visit Summary has been reviewed and is understood.  Full responsibility of the confidentiality of this discharge information lies with you and/or your care-partner.

## 2024-05-01 NOTE — Progress Notes (Signed)
 A/o x 3, VSS, good SR's, pleased with anesthesia, report to RN

## 2024-05-01 NOTE — Progress Notes (Addendum)
 BP recheck R arm 200/128 at bedside. Pt denies SOB, headache, dizziness, chest pain. Pt states he has not taken Lisinopril  for several days. Lisa CRNA notified and came to bedside to assess pt prior to procedure.

## 2024-05-02 ENCOUNTER — Telehealth: Payer: Self-pay | Admitting: *Deleted

## 2024-05-02 NOTE — Telephone Encounter (Signed)
 Attempted f/u phone call. No answer. Left message.

## 2024-05-06 LAB — SURGICAL PATHOLOGY

## 2024-05-09 ENCOUNTER — Ambulatory Visit: Payer: Self-pay | Admitting: Gastroenterology

## 2024-05-09 NOTE — Progress Notes (Signed)
 Mr. Erik Velasquez,   All four polyps that I removed during your recent procedure were completely benign but were proven to be pre-cancerous polyps that MAY have grown into cancers if they had not been removed.  Studies shows that at least 20% of women over age 43 and 30% of men over age 62 have pre-cancerous polyps.  Based on current nationally recognized surveillance guidelines, I recommend that you have a repeat colonoscopy in 3 years.   If you develop any new rectal bleeding, abdominal pain or significant bowel habit changes, please contact me before then.
